# Patient Record
Sex: Male | Born: 1960 | Race: Black or African American | Hispanic: No | Marital: Married | Smoking: Never smoker
Health system: Southern US, Community
[De-identification: ages and names within clinical notes are randomized; demographics above are authoritative.]

## PROBLEM LIST (undated history)

## (undated) DIAGNOSIS — J45909 Unspecified asthma, uncomplicated: Secondary | ICD-10-CM

## (undated) HISTORY — DX: Unspecified asthma, uncomplicated: J45.909

## (undated) SURGERY — EXCISION, LESION, EYELID
Anesthesia: LOCAL | Laterality: Left

---

## 2004-10-10 ENCOUNTER — Encounter: Admission: RE | Admit: 2004-10-10 | Discharge: 2004-10-10 | Payer: Self-pay | Admitting: Occupational Medicine

## 2009-11-05 ENCOUNTER — Emergency Department (HOSPITAL_BASED_OUTPATIENT_CLINIC_OR_DEPARTMENT_OTHER): Admission: EM | Admit: 2009-11-05 | Discharge: 2009-11-05 | Payer: Self-pay | Admitting: Emergency Medicine

## 2009-11-05 ENCOUNTER — Ambulatory Visit: Payer: Self-pay | Admitting: Diagnostic Radiology

## 2010-06-10 IMAGING — CT CT ABD-PELV W/ CM
2 of 5 series · 16 of 46 positions shown, 18 images · IV contrast (APPLIED)
Comparison: None

CLINICAL DATA: Upper abdominal pain.  History of hernia repair.
History of partial gastrectomy.

CT ABDOMEN AND PELVIS WITH CONTRAST
TECHNIQUE: Multidetector CT imaging of the abdomen and pelvis was
performed following the standard protocol during bolus
administration of intravenous contrast.
Contrast: 100 ml of Imnipaque-Y33

[Series 2: abd/pelvis 5.0 b31f · axial · 0.74mm/px · z∈[-399,-29]mm · 13 of 84 slices shown, 15 images]
[im 5/84  soft-tissue]
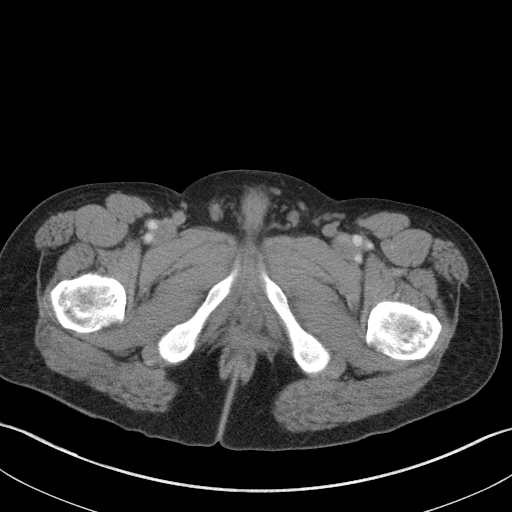
[im 5/84  bone]
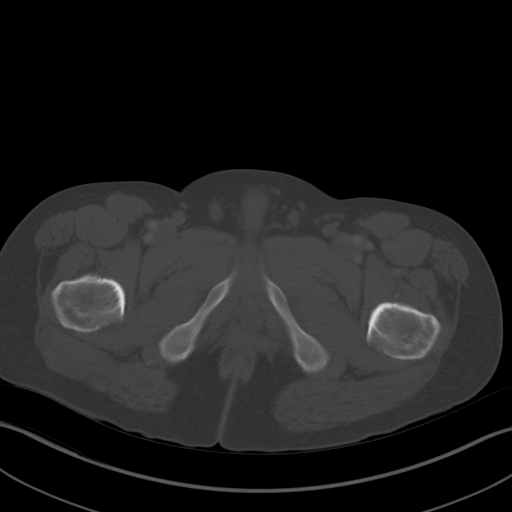
[im 10/84  soft-tissue]
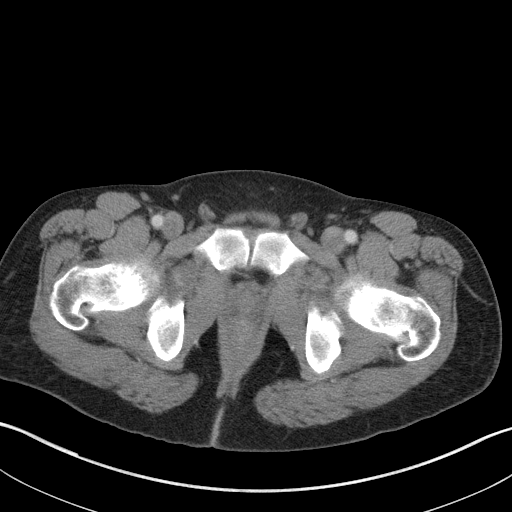
[im 19/84  soft-tissue]
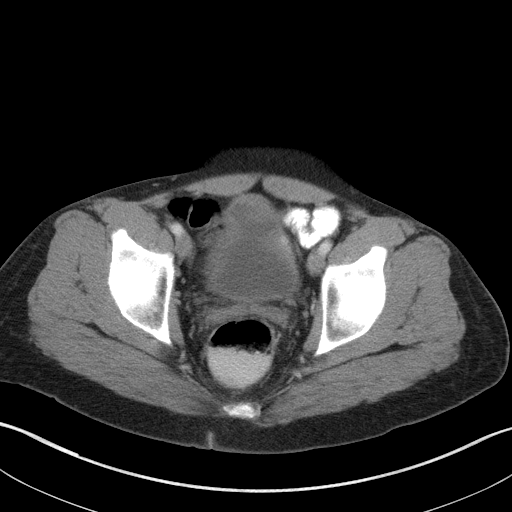
[im 24/84  soft-tissue]
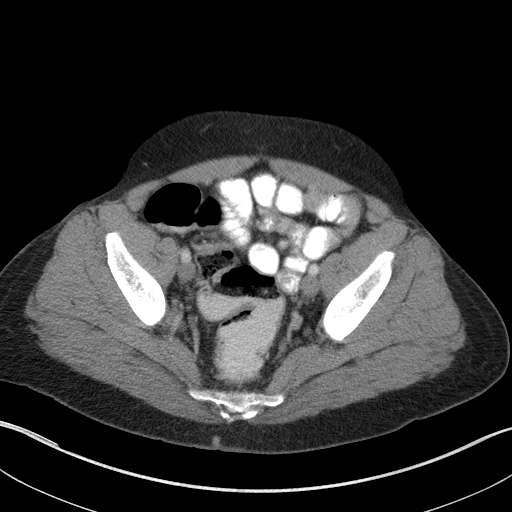
[im 28/84  soft-tissue]
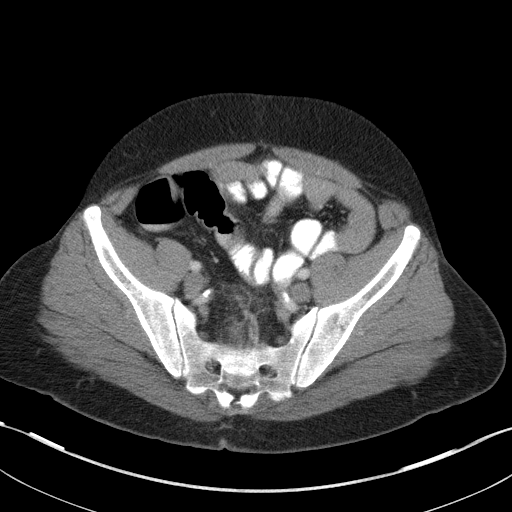
[im 37/84  soft-tissue]
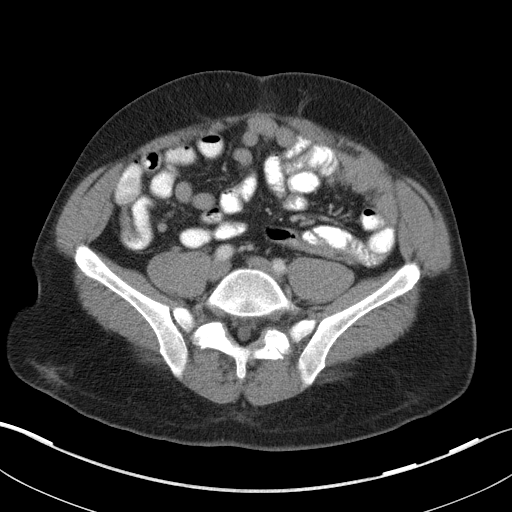
[im 42/84  soft-tissue]
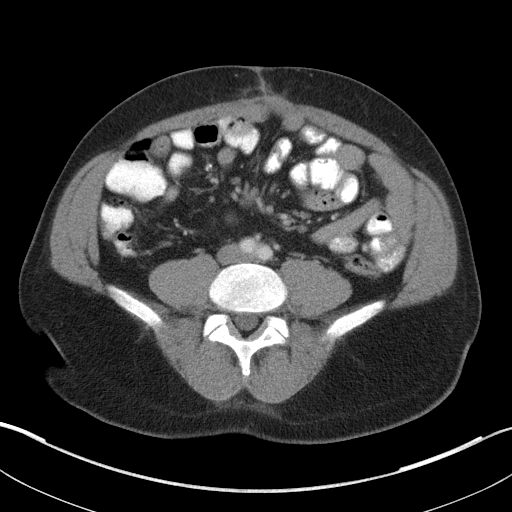
[im 47/84  soft-tissue]
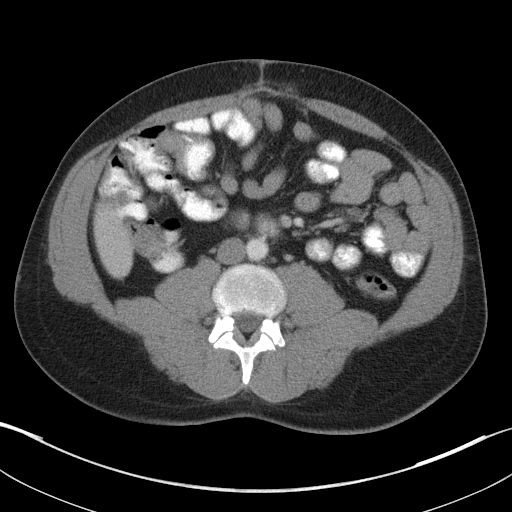
[im 56/84  soft-tissue]
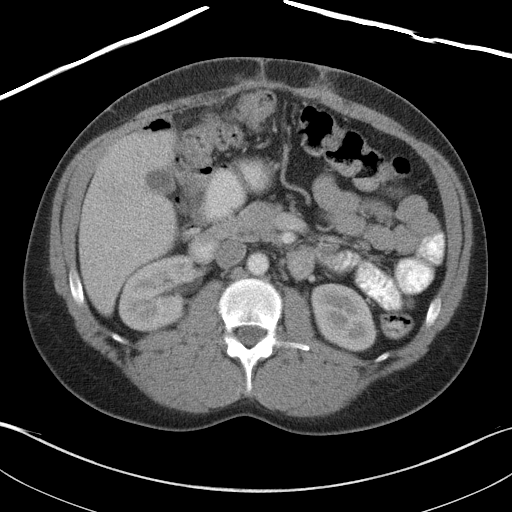
[im 56/84  bone]
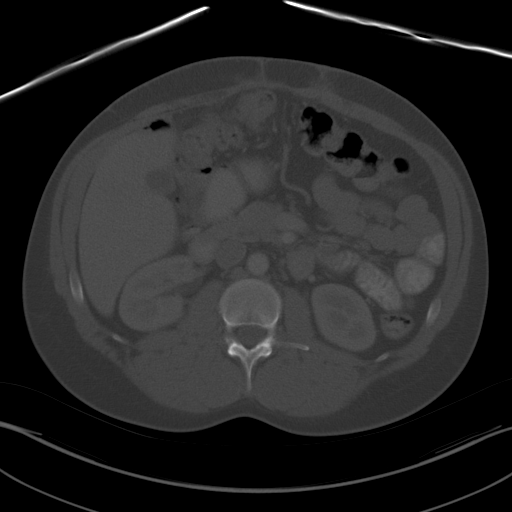
[im 60/84  soft-tissue]
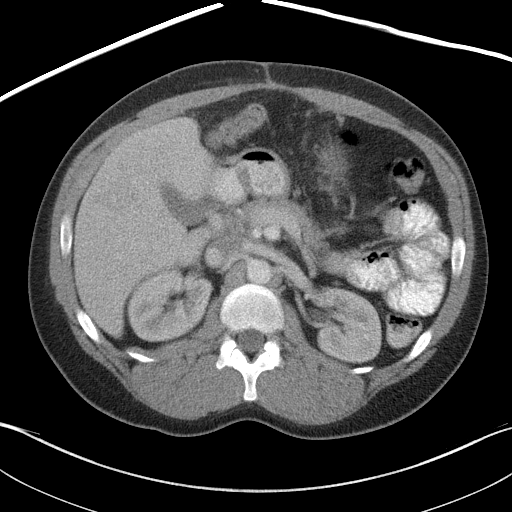
[im 65/84  soft-tissue]
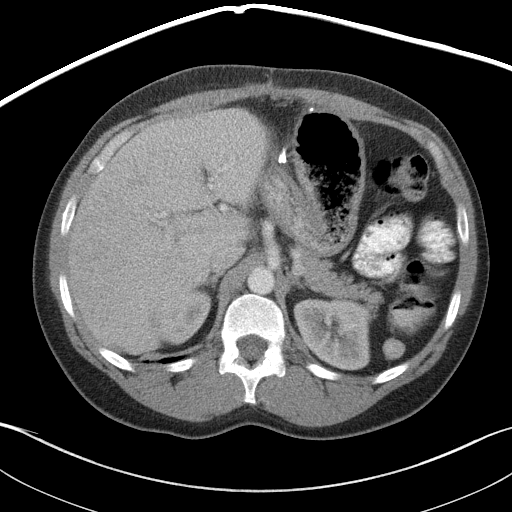
[im 74/84  soft-tissue]
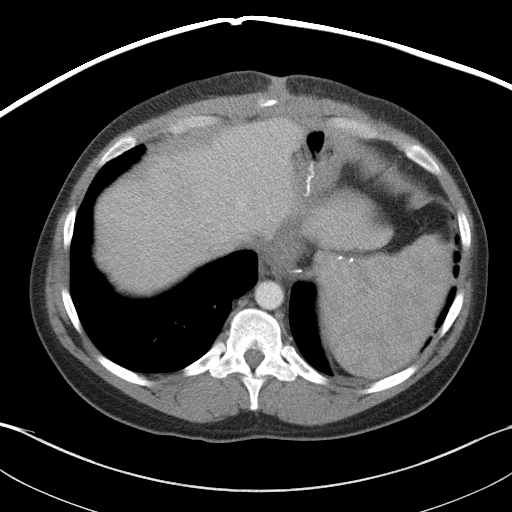
[im 79/84  soft-tissue]
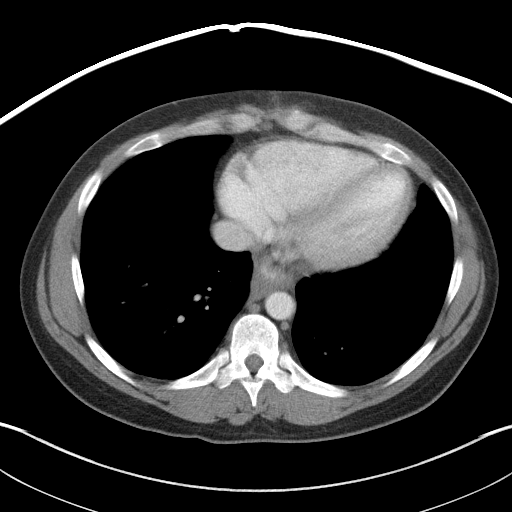

[Series 5: abd/pelvis 3.0 coronal · coronal · 0.76mm/px · 3 of 95 slices shown]
[im 32/95  soft-tissue]
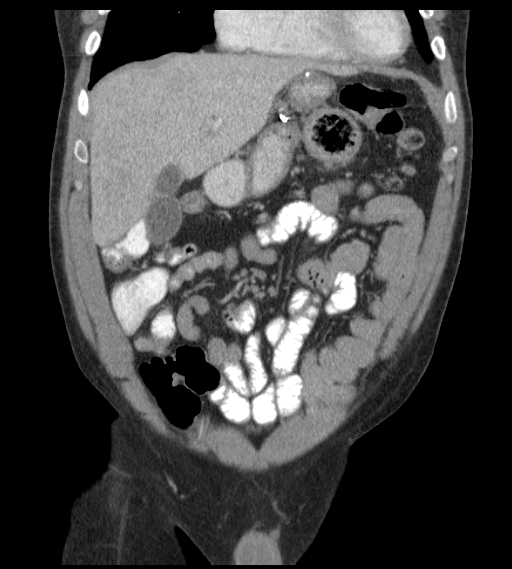
[im 42/95  soft-tissue]
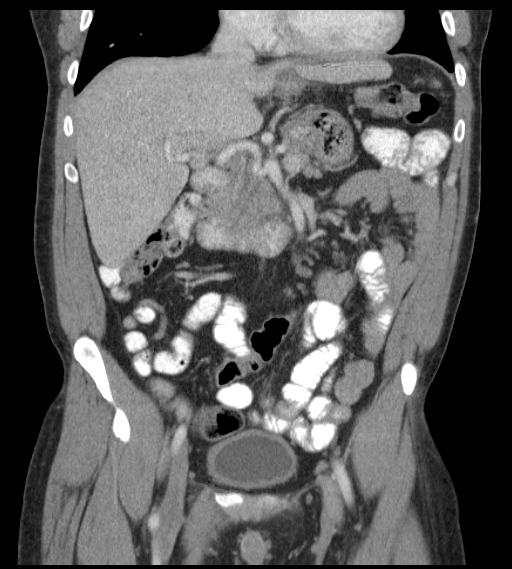
[im 53/95  soft-tissue]
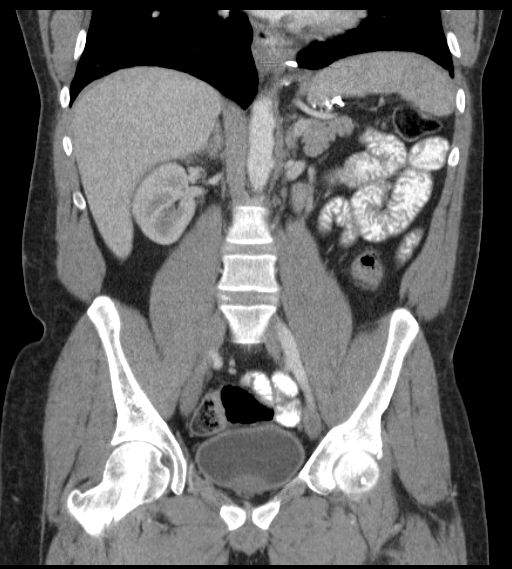

[16 of 46 positions shown; findings below may reference images not displayed]

FINDINGS: The lung bases are clear except for minimal dependent
atelectasis.  The distal esophagus demonstrates diffuse wall
thickening.  This may be due to reflux esophagitis.  A slipped
Nissen fundoplication is also possible.

There are surgical changes involving the stomach.  No complicating
features are demonstrated.  The duodenum, small bowel and colon
unremarkable.  No findings for small bowel obstruction or bowel
ischemia.

There are too small low attenuation liver lesions which are likely
benign cysts.  No biliary dilatation.  The gallbladder appears
normal.  The spleen is normal in size.  The pancreas, adrenal
glands and kidneys demonstrate no significant abnormalities.

The aorta is normal in caliber.  The major branch vessels are
normal.  No mesenteric or retroperitoneal masses or adenopathy.

There are surgical changes involving the anterior abdominal wall.
No hernia is seen.

The rectum, sigmoid colon and visualized small bowel loops are
unremarkable.  The appendix is normal.  No pelvic mass or
adenopathy.  The bladder, prostate gland and seminal vesicles are
unremarkable.  No inguinal mass or hernia.

The bony structures are unremarkable.
IMPRESSION: 1.  Abnormal appearance of the distal esophagus.  Findings could be
due to esophagitis.  A slipped Nissen fundoplication is possible.
Recommend correlation with any symptoms of dysphagia.
2.  Surgical changes involving the stomach.
3.  No acute abdominal/pelvic findings, mass lesions or adenopathy.

## 2010-12-13 LAB — DIFFERENTIAL
Eosinophils Absolute: 1.9 10*3/uL — ABNORMAL HIGH (ref 0.0–0.7)
Lymphocytes Relative: 12 % (ref 12–46)
Lymphs Abs: 0.9 10*3/uL (ref 0.7–4.0)
Monocytes Relative: 10 % (ref 3–12)
Neutrophils Relative %: 53 % (ref 43–77)

## 2010-12-13 LAB — COMPREHENSIVE METABOLIC PANEL
ALT: 15 U/L (ref 0–53)
AST: 11 U/L (ref 0–37)
Calcium: 8.7 mg/dL (ref 8.4–10.5)
Creatinine, Ser: 1 mg/dL (ref 0.4–1.5)
GFR calc Af Amer: >60 mL/min (ref >60)
Sodium: 144 mEq/L (ref 135–145)
Total Protein: 6 g/dL (ref 6.0–8.3)

## 2010-12-13 LAB — CBC
MCHC: 30.9 g/dL (ref 30.0–36.0)
RDW: 23.7 % — ABNORMAL HIGH (ref 11.5–15.5)

## 2012-02-20 ENCOUNTER — Encounter: Payer: Self-pay | Admitting: Ophthalmology

## 2012-02-20 ENCOUNTER — Other Ambulatory Visit: Payer: Self-pay | Admitting: Ophthalmology

## 2012-02-20 NOTE — Brief Op Note (Addendum)
02/21/2012  7:14 PM  PATIENT:  Alexander Hodge  51 y.o. male  PRE-OPERATIVE DIAGNOSIS:  Cysts upper and lower lids left eye  POST-OPERATIVE DIAGNOSIS:  * Same *  PROCEDURE:  Procedure(s) (LRB): Cyst excision upper lid left eye  SURGEON:  Surgeon(s) and Role:    Vita Erm., MD - Primary  PHYSICIAN ASSISTANT:   ASSISTANTS: none   ANESTHESIA:   local  EBL:   none  BLOOD ADMINISTERED:none  None  DRAINS: none   LOCAL MEDICATIONS USED:  XYLOCAINE   SPECIMEN:  Excision  DISPOSITION OF SPECIMEN:  PATHOLOGY  COUNTS:  YES  TOURNIQUET:  * No tourniquets in log *  DICTATION: .Dragon Dictation  PLAN OF CARE: Discharge to home after PACU  PATIENT DISPOSITION:  Short Stay   Delay start of Pharmacological VTE agent (>24hrs) due to surgical blood loss or risk of bleeding: not applicable

## 2012-02-20 NOTE — H&P (Signed)
  51 yo male has multiple cysts lateral canthus left eye involving the upper and lower lids.  Lesions seem to be increasing in size and number.  Cysts are sometimes tender.  Patient requested that the lesions be excised.  Patient admitted at this time for cyst excision.

## 2012-02-21 ENCOUNTER — Encounter (HOSPITAL_BASED_OUTPATIENT_CLINIC_OR_DEPARTMENT_OTHER): Payer: Self-pay | Admitting: *Deleted

## 2012-02-21 ENCOUNTER — Encounter (HOSPITAL_BASED_OUTPATIENT_CLINIC_OR_DEPARTMENT_OTHER)
Admission: RE | Disposition: A | Discharge status: Home / Self Care | Payer: Self-pay | Source: Ambulatory Visit | Attending: Ophthalmology

## 2012-02-21 ENCOUNTER — Ambulatory Visit (HOSPITAL_BASED_OUTPATIENT_CLINIC_OR_DEPARTMENT_OTHER)
Admission: RE | Admit: 2012-02-21 | Discharge: 2012-02-21 | Disposition: A | Discharge status: Home / Self Care | Payer: 59 | Source: Ambulatory Visit | Attending: Ophthalmology | Admitting: Ophthalmology

## 2012-02-21 DIAGNOSIS — H02829 Cysts of unspecified eye, unspecified eyelid: Secondary | ICD-10-CM | POA: Insufficient documentation

## 2012-02-21 HISTORY — PX: MASS EXCISION: SHX2000

## 2012-02-21 SURGERY — MINOR EXCISION OF MASS
Anesthesia: LOCAL | Site: Eye | Laterality: Left | Wound class: Clean

## 2012-02-21 MED ORDER — LIDOCAINE HCL 1 % IJ SOLN
INTRAMUSCULAR | Status: DC | PRN
  Administered 2012-02-21: 5 mL

## 2012-02-21 MED ORDER — BACITRACIN-POLYMYXIN B 500-10000 UNIT/GM OP OINT
TOPICAL_OINTMENT | OPHTHALMIC | Status: DC | PRN
  Administered 2012-02-21: 1 via OPHTHALMIC

## 2012-02-21 SURGICAL SUPPLY — 19 items
APPLICATOR COTTON TIP 6IN STRL (MISCELLANEOUS) ×3 IMPLANT
BLADE SURG 11 STRL SS (BLADE) ×3 IMPLANT
CAUTERY EYE LOW TEMP 1300F FIN (OPHTHALMIC RELATED) ×3 IMPLANT
CLOTH BEACON ORANGE TIMEOUT ST (SAFETY) ×3 IMPLANT
GAUZE SPONGE 4X4 12PLY STRL LF (GAUZE/BANDAGES/DRESSINGS) ×3 IMPLANT
GLOVE BIO SURGEON STRL SZ 6.5 (GLOVE) ×3 IMPLANT
GLOVE ECLIPSE 7.0 STRL STRAW (GLOVE) ×3 IMPLANT
MARKER SKIN DUAL TIP RULER LAB (MISCELLANEOUS) ×3 IMPLANT
NDL SAFETY ECLIPSE 18X1.5 (NEEDLE) ×2 IMPLANT
NEEDLE HYPO 18GX1.5 SHARP (NEEDLE) ×3
NEEDLE HYPO 25X5/8 SAFETYGLIDE (NEEDLE) ×3 IMPLANT
PAD ALCOHOL SWAB (MISCELLANEOUS) ×3 IMPLANT
PAD EYE OVAL STERILE LF (GAUZE/BANDAGES/DRESSINGS) ×6 IMPLANT
SPONGE GAUZE 4X4 12PLY (GAUZE/BANDAGES/DRESSINGS) ×3 IMPLANT
SUT SILK 6 0 P 1 (SUTURE) ×3 IMPLANT
SWABSTICK POVIDONE IODINE SNGL (MISCELLANEOUS) ×6 IMPLANT
SYR CONTROL 10ML LL (SYRINGE) ×3 IMPLANT
TOWEL OR 17X24 6PK STRL BLUE (TOWEL DISPOSABLE) ×3 IMPLANT
UNDERPAD 30X30 INCONTINENT (UNDERPADS AND DIAPERS) ×3 IMPLANT

## 2012-02-21 NOTE — Discharge Instructions (Signed)
Remove patch tomorrow.  Then resume drops or ointment to leftt eye 4 x day.  Call office for appointment 1 week.

## 2012-02-24 ENCOUNTER — Encounter (HOSPITAL_BASED_OUTPATIENT_CLINIC_OR_DEPARTMENT_OTHER): Payer: Self-pay

## 2012-02-24 NOTE — Brief Op Note (Addendum)
02/21/2012  12:39 PM  PATIENT:  Alexander Hodge  51 y.o. male  PRE-OPERATIVE DIAGNOSIS:  Cysts upper lid left eye POST-OPERATIVE DIAGNOSIS:  Same  PROCEDURE:  Procedure(s) (LRB): MINOR EXCISION OF MASS ()  SURGEON:  Surgeon(s) and Role:    Vita Erm., MD - Primary  PHYSICIAN ASSISTANT:   ASSISTANTS: none   ANESTHESIA:   local  EBL:     BLOOD ADMINISTERED:none  DRAINS: none   LOCAL MEDICATIONS USED:  XYLOCAINE   SPECIMEN:  Excision  DISPOSITION OF SPECIMEN:  PATHOLOGY  COUNTS:  YES  TOURNIQUET:  * No tourniquets in log *  DICTATION: .Other Dictation: Dictation Number [id number]  PLAN OF CARE: Discharge to home after PACU  PATIENT DISPOSITION:  Short Stay   Delay start of Pharmacological VTE agent (>24hrs) due to surgical blood loss or risk of bleeding: yes

## 2012-02-24 NOTE — Op Note (Addendum)
NAMEFAOLAN, [place] NO.:  [id number]  MEDICAL RECORD NO.:  [id number]  LOCATION:                                 FACILITY:  PHYSICIAN:  Salley Scarlet., M.D. DATE OF BIRTH:  DATE OF PROCEDURE:  02/22/2012 DATE OF DISCHARGE:                              OPERATIVE REPORT   This is a 51 year old gentleman who complained of having multiple cystic lesions along the upper and lower lids of the left eye with a greatest concentration located along the lateral canthus.  He stated that these structures were increasing in size and at times were tender to touch.  He requested that the larger lesions be removed.  He, therefore,is admitted at this time for excision of the cysts. PROCEDURE:  The patient was prepped and draped in the usual manner.  The upper and lower lids in the region of his lateral canthus were infiltrated with several mL of Xylocaine 1%.  A curvilinear incision was then made around the adjacent lesions located along the lateral aspect of the upper lid and lateral canthus.  The skin was then undermined using sharp and blunt dissection.  An elliptical area of tissue about 1.5 x 1.5 mm was excised.  The skin was reapproximated with 6-0 silk sutures after achieving satisfactory hemostasis.  Polysporin Ophthalmic ointment and a pressure patch was applied.  The patient tolerated the procedure well on discharge at postanesthesia recovery room in satisfactory condition.  He is instructed to keep the patch on today, to remove it tomorrow, to resume the Polysporin Ophthalmic ointment, and to see me in my office in one week.  DISCHARGE DIAGNOSIS:  Multiple cysts, upper and lower lid, left eye      Salley Scarlet., M.D.     TB/MEDQ  D:  02/22/2012  T:  02/22/2012  Job:  [id number]

## 2012-02-26 ENCOUNTER — Encounter (HOSPITAL_BASED_OUTPATIENT_CLINIC_OR_DEPARTMENT_OTHER): Payer: Self-pay | Admitting: Ophthalmology

## 2012-11-02 IMAGING — CR DG FINGER INDEX 2+V*R*
1 series · 1 of 1 positions shown · non-contrast
Comparison: None.

CLINICAL DATA: Finger pain.  Trauma.

RIGHT INDEX FINGER 2+V

[PA]
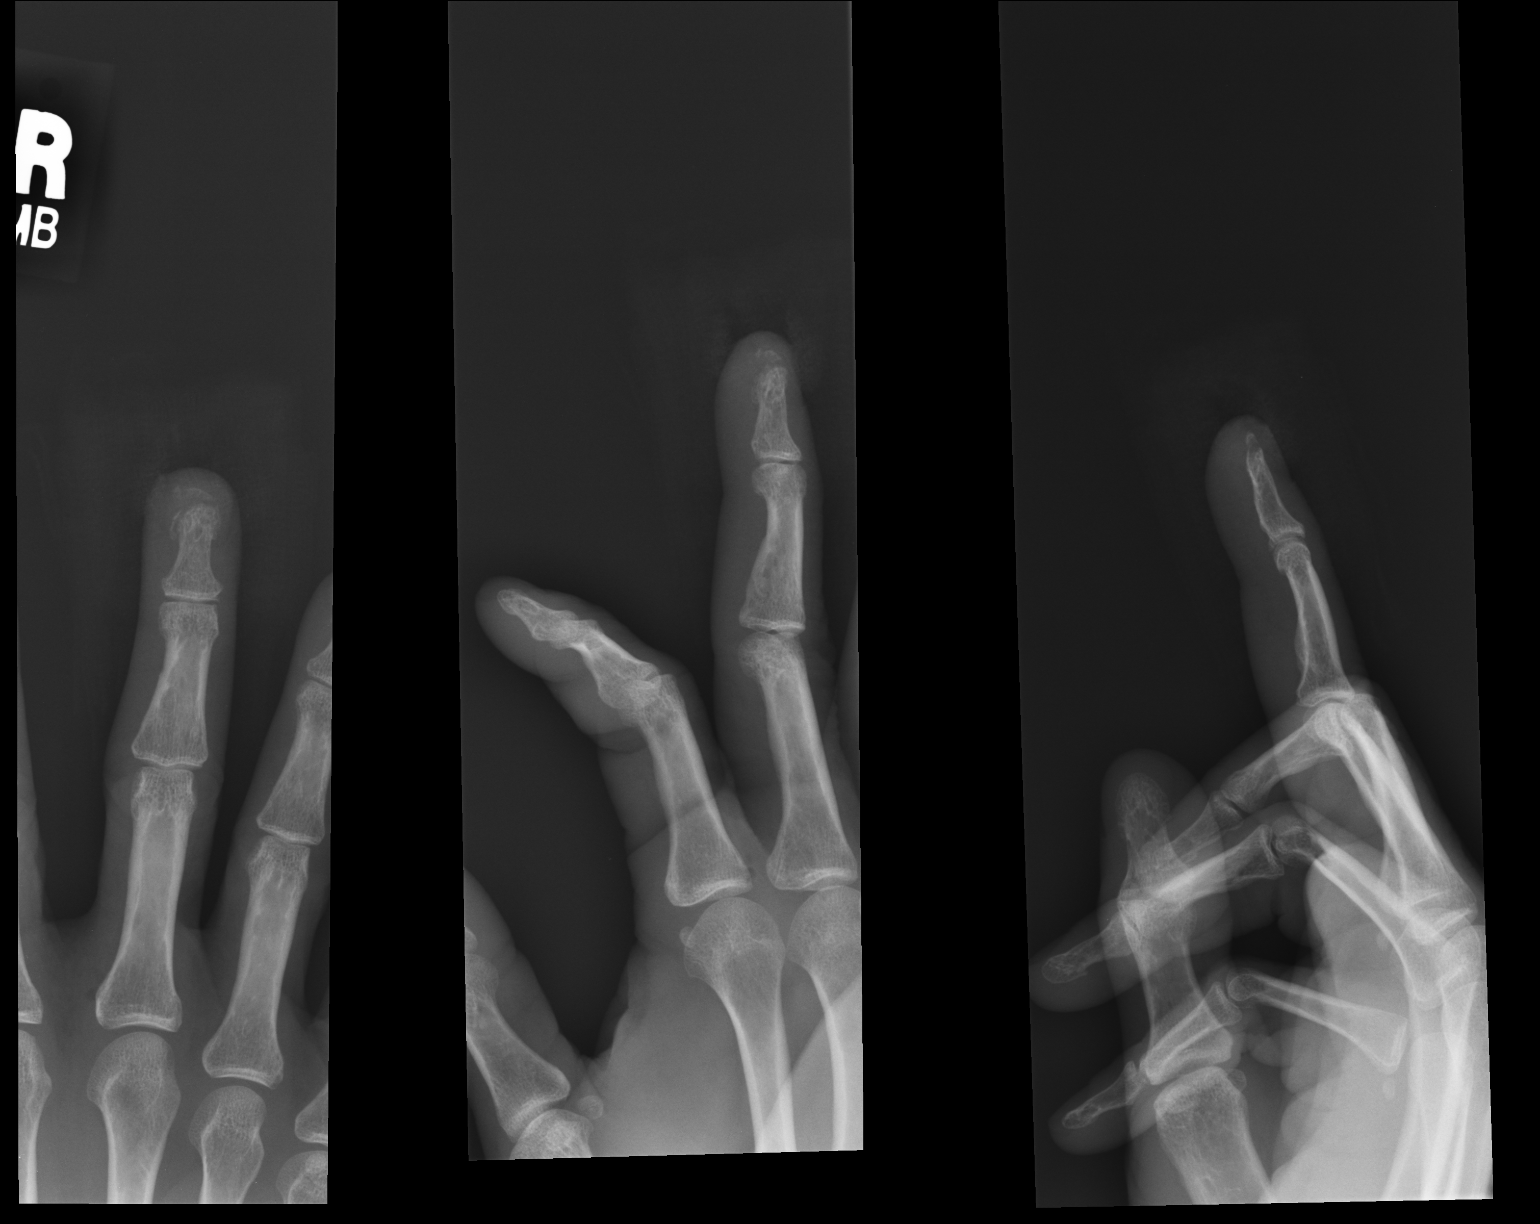

[1 of 1 positions shown; findings below may reference images not displayed]

FINDINGS: Predominately transverse but comminuted right long finger
tuft fracture is present.  Overlying soft tissue swelling.  Soft
tissue laceration along what appears to be the radial surface.

The fracture fragments are mildly displaced, with displacement of
the finger tuft fragment 1 mm.
IMPRESSION: Comminuted long finger tuft fracture.

## 2013-10-20 DIAGNOSIS — I780 Hereditary hemorrhagic telangiectasia: Secondary | ICD-10-CM | POA: Insufficient documentation

## 2013-10-20 DIAGNOSIS — R040 Epistaxis: Secondary | ICD-10-CM | POA: Insufficient documentation

## 2013-11-22 DIAGNOSIS — R0683 Snoring: Secondary | ICD-10-CM | POA: Insufficient documentation

## 2013-11-22 DIAGNOSIS — R5383 Other fatigue: Secondary | ICD-10-CM | POA: Insufficient documentation

## 2014-05-29 DIAGNOSIS — M7542 Impingement syndrome of left shoulder: Secondary | ICD-10-CM

## 2014-05-29 DIAGNOSIS — D681 Hereditary factor XI deficiency: Secondary | ICD-10-CM | POA: Insufficient documentation

## 2014-05-29 DIAGNOSIS — M25511 Pain in right shoulder: Secondary | ICD-10-CM | POA: Insufficient documentation

## 2014-05-29 DIAGNOSIS — D649 Anemia, unspecified: Secondary | ICD-10-CM | POA: Insufficient documentation

## 2014-05-29 DIAGNOSIS — M7521 Bicipital tendinitis, right shoulder: Secondary | ICD-10-CM | POA: Insufficient documentation

## 2014-05-29 DIAGNOSIS — J45909 Unspecified asthma, uncomplicated: Secondary | ICD-10-CM | POA: Insufficient documentation

## 2014-05-29 DIAGNOSIS — M7541 Impingement syndrome of right shoulder: Secondary | ICD-10-CM | POA: Insufficient documentation

## 2015-05-25 DIAGNOSIS — J3089 Other allergic rhinitis: Secondary | ICD-10-CM | POA: Insufficient documentation

## 2015-05-25 DIAGNOSIS — D681 Hereditary factor XI deficiency: Secondary | ICD-10-CM

## 2015-05-25 DIAGNOSIS — D3A092 Benign carcinoid tumor of the stomach: Secondary | ICD-10-CM | POA: Insufficient documentation

## 2015-05-25 DIAGNOSIS — J339 Nasal polyp, unspecified: Secondary | ICD-10-CM | POA: Insufficient documentation

## 2015-05-25 DIAGNOSIS — K219 Gastro-esophageal reflux disease without esophagitis: Secondary | ICD-10-CM | POA: Insufficient documentation

## 2015-05-25 DIAGNOSIS — J309 Allergic rhinitis, unspecified: Secondary | ICD-10-CM

## 2015-05-25 DIAGNOSIS — J4550 Severe persistent asthma, uncomplicated: Secondary | ICD-10-CM | POA: Insufficient documentation

## 2015-05-25 DIAGNOSIS — I780 Hereditary hemorrhagic telangiectasia: Secondary | ICD-10-CM

## 2015-06-02 ENCOUNTER — Other Ambulatory Visit: Payer: Self-pay

## 2015-06-02 MED ORDER — OMALIZUMAB 150 MG ~~LOC~~ SOLR
375.0000 mg | SUBCUTANEOUS | Status: DC
  Administered 2015-07-24 – 2021-08-02 (×51): 375 mg via SUBCUTANEOUS

## 2015-07-17 ENCOUNTER — Other Ambulatory Visit: Payer: Self-pay | Admitting: *Deleted

## 2015-07-17 MED ORDER — ALBUTEROL SULFATE HFA 108 (90 BASE) MCG/ACT IN AERS: 2.0000 | INHALATION_SPRAY | RESPIRATORY_TRACT | Status: DC | PRN

## 2015-07-24 ENCOUNTER — Ambulatory Visit (INDEPENDENT_AMBULATORY_CARE_PROVIDER_SITE_OTHER): Payer: 59

## 2015-07-24 DIAGNOSIS — J4540 Moderate persistent asthma, uncomplicated: Secondary | ICD-10-CM | POA: Diagnosis not present

## 2015-08-31 ENCOUNTER — Ambulatory Visit (INDEPENDENT_AMBULATORY_CARE_PROVIDER_SITE_OTHER): Payer: 59 | Admitting: *Deleted

## 2015-08-31 DIAGNOSIS — J4540 Moderate persistent asthma, uncomplicated: Secondary | ICD-10-CM | POA: Diagnosis not present

## 2015-09-12 ENCOUNTER — Ambulatory Visit: Payer: 59 | Admitting: Pediatrics

## 2015-09-19 DIAGNOSIS — K529 Noninfective gastroenteritis and colitis, unspecified: Secondary | ICD-10-CM | POA: Insufficient documentation

## 2015-09-19 DIAGNOSIS — D509 Iron deficiency anemia, unspecified: Secondary | ICD-10-CM | POA: Insufficient documentation

## 2015-09-19 DIAGNOSIS — J984 Other disorders of lung: Secondary | ICD-10-CM | POA: Insufficient documentation

## 2015-09-19 DIAGNOSIS — G8929 Other chronic pain: Secondary | ICD-10-CM | POA: Insufficient documentation

## 2015-09-19 DIAGNOSIS — R413 Other amnesia: Secondary | ICD-10-CM | POA: Insufficient documentation

## 2015-09-19 DIAGNOSIS — Z9189 Other specified personal risk factors, not elsewhere classified: Secondary | ICD-10-CM | POA: Insufficient documentation

## 2015-09-19 DIAGNOSIS — K2100 Gastro-esophageal reflux disease with esophagitis, without bleeding: Secondary | ICD-10-CM | POA: Insufficient documentation

## 2015-09-19 DIAGNOSIS — H9202 Otalgia, left ear: Secondary | ICD-10-CM | POA: Insufficient documentation

## 2015-09-19 DIAGNOSIS — K449 Diaphragmatic hernia without obstruction or gangrene: Secondary | ICD-10-CM | POA: Insufficient documentation

## 2015-09-19 DIAGNOSIS — Z7952 Long term (current) use of systemic steroids: Secondary | ICD-10-CM | POA: Insufficient documentation

## 2015-09-19 DIAGNOSIS — Z86010 Personal history of colonic polyps: Secondary | ICD-10-CM | POA: Insufficient documentation

## 2015-09-19 DIAGNOSIS — J4530 Mild persistent asthma, uncomplicated: Secondary | ICD-10-CM | POA: Insufficient documentation

## 2015-09-19 DIAGNOSIS — L2089 Other atopic dermatitis: Secondary | ICD-10-CM | POA: Insufficient documentation

## 2015-09-19 DIAGNOSIS — J8410 Pulmonary fibrosis, unspecified: Secondary | ICD-10-CM | POA: Insufficient documentation

## 2015-09-19 DIAGNOSIS — K625 Hemorrhage of anus and rectum: Secondary | ICD-10-CM | POA: Insufficient documentation

## 2015-09-19 DIAGNOSIS — K921 Melena: Secondary | ICD-10-CM | POA: Insufficient documentation

## 2015-09-19 DIAGNOSIS — M2550 Pain in unspecified joint: Secondary | ICD-10-CM | POA: Insufficient documentation

## 2015-09-19 DIAGNOSIS — E291 Testicular hypofunction: Secondary | ICD-10-CM | POA: Insufficient documentation

## 2015-09-19 DIAGNOSIS — N6320 Unspecified lump in the left breast, unspecified quadrant: Secondary | ICD-10-CM | POA: Insufficient documentation

## 2015-09-19 DIAGNOSIS — K648 Other hemorrhoids: Secondary | ICD-10-CM | POA: Insufficient documentation

## 2015-09-19 DIAGNOSIS — D126 Benign neoplasm of colon, unspecified: Secondary | ICD-10-CM | POA: Insufficient documentation

## 2015-09-19 DIAGNOSIS — K432 Incisional hernia without obstruction or gangrene: Secondary | ICD-10-CM | POA: Insufficient documentation

## 2015-09-19 DIAGNOSIS — K649 Unspecified hemorrhoids: Secondary | ICD-10-CM | POA: Insufficient documentation

## 2015-09-19 DIAGNOSIS — N529 Male erectile dysfunction, unspecified: Secondary | ICD-10-CM | POA: Insufficient documentation

## 2015-09-19 DIAGNOSIS — G4700 Insomnia, unspecified: Secondary | ICD-10-CM | POA: Insufficient documentation

## 2015-09-19 DIAGNOSIS — R109 Unspecified abdominal pain: Secondary | ICD-10-CM | POA: Insufficient documentation

## 2015-09-19 DIAGNOSIS — F339 Major depressive disorder, recurrent, unspecified: Secondary | ICD-10-CM | POA: Insufficient documentation

## 2015-09-19 DIAGNOSIS — B356 Tinea cruris: Secondary | ICD-10-CM | POA: Insufficient documentation

## 2015-09-19 DIAGNOSIS — M5416 Radiculopathy, lumbar region: Secondary | ICD-10-CM | POA: Insufficient documentation

## 2015-09-19 DIAGNOSIS — D500 Iron deficiency anemia secondary to blood loss (chronic): Secondary | ICD-10-CM | POA: Insufficient documentation

## 2015-09-19 DIAGNOSIS — N401 Enlarged prostate with lower urinary tract symptoms: Secondary | ICD-10-CM | POA: Insufficient documentation

## 2015-09-19 DIAGNOSIS — R062 Wheezing: Secondary | ICD-10-CM | POA: Insufficient documentation

## 2015-09-19 DIAGNOSIS — N50811 Right testicular pain: Secondary | ICD-10-CM | POA: Insufficient documentation

## 2015-09-19 DIAGNOSIS — J479 Bronchiectasis, uncomplicated: Secondary | ICD-10-CM | POA: Insufficient documentation

## 2015-09-19 DIAGNOSIS — M159 Polyosteoarthritis, unspecified: Secondary | ICD-10-CM | POA: Insufficient documentation

## 2015-09-19 DIAGNOSIS — G4733 Obstructive sleep apnea (adult) (pediatric): Secondary | ICD-10-CM | POA: Insufficient documentation

## 2015-09-19 DIAGNOSIS — J343 Hypertrophy of nasal turbinates: Secondary | ICD-10-CM | POA: Insufficient documentation

## 2015-09-19 DIAGNOSIS — R10A2 Flank pain, left side: Secondary | ICD-10-CM | POA: Insufficient documentation

## 2015-09-19 DIAGNOSIS — M545 Low back pain: Secondary | ICD-10-CM

## 2015-09-19 DIAGNOSIS — N481 Balanitis: Secondary | ICD-10-CM | POA: Insufficient documentation

## 2015-09-19 DIAGNOSIS — G2581 Restless legs syndrome: Secondary | ICD-10-CM | POA: Insufficient documentation

## 2015-09-28 ENCOUNTER — Encounter: Payer: Self-pay | Admitting: Pediatrics

## 2015-09-28 ENCOUNTER — Ambulatory Visit (INDEPENDENT_AMBULATORY_CARE_PROVIDER_SITE_OTHER): Payer: 59 | Admitting: Pediatrics

## 2015-09-28 VITALS — BP 108/62 | HR 100 | Temp 98.3°F | Resp 24 | Ht 67.0 in | Wt 182.8 lb

## 2015-09-28 DIAGNOSIS — J4551 Severe persistent asthma with (acute) exacerbation: Secondary | ICD-10-CM

## 2015-09-28 DIAGNOSIS — K219 Gastro-esophageal reflux disease without esophagitis: Secondary | ICD-10-CM | POA: Diagnosis not present

## 2015-09-28 MED ORDER — LEVALBUTEROL HCL 1.25 MG/3ML IN NEBU
1.2500 mg | INHALATION_SOLUTION | Freq: Once | RESPIRATORY_TRACT | Status: AC
  Administered 2015-09-28: 1.25 mg via RESPIRATORY_TRACT

## 2015-09-28 MED ORDER — TIOTROPIUM BROMIDE MONOHYDRATE 1.25 MCG/ACT IN AERS: 2.0000 | INHALATION_SPRAY | Freq: Every day | RESPIRATORY_TRACT | Status: DC

## 2015-09-28 MED ORDER — LEVALBUTEROL HCL 1.25 MG/3ML IN NEBU: 1.2500 mg | INHALATION_SOLUTION | Freq: Four times a day (QID) | RESPIRATORY_TRACT | Status: DC | PRN

## 2015-09-28 MED ORDER — ALBUTEROL SULFATE HFA 108 (90 BASE) MCG/ACT IN AERS: 2.0000 | INHALATION_SPRAY | RESPIRATORY_TRACT | Status: DC | PRN

## 2015-09-28 MED ORDER — MONTELUKAST SODIUM 10 MG PO TABS: 10.0000 mg | ORAL_TABLET | Freq: Every morning | ORAL | Status: DC

## 2015-09-28 MED ORDER — FLUTICASONE FUROATE-VILANTEROL 200-25 MCG/INH IN AEPB: 1.0000 | INHALATION_SPRAY | Freq: Every day | RESPIRATORY_TRACT | Status: DC

## 2015-09-28 NOTE — Progress Notes (Signed)
[place] [address] Dept: [phone]  FOLLOW UP NOTE  Patient ID: Alexander Hodge, male    DOB: 21-Nov-1960  Age: 55 y.o. MRN: [id number] Date of Office Visit: 09/28/2015  Assessment Chief Complaint: Wheezing  HPI Alexander Hodge presents for treatment of an exacerbation of asthma. He is on Xolair 375 mg every 2 weeks He currently sees Dr. Camillo Flaming at [facility]. He was switched from Symbicort to BreoEllipta 100/25 one puff every 24 hours and they stopped the Spiriva. He feels that he did better on Spiriva.. He also placed him on Theodur . He needed 2 blood transfusions recently. He has hereditary  hemorrhagic telangiectasia  Current medications are montelukast asked 10 mg once a dayTheodur 300 mg every 12 hours, Xopenex 1.25 mg every 6 hours if needed, Ventolin 2 puffs every 4 hours if needed, fluticasone 1 spray per nostril once a day if needed, omeprazole 40 mg once a day his other medications are outlined in the chart   Drug Allergies:  Allergies  Allergen Reactions  . Eggs Or Egg-Derived Products Shortness Of Breath  . Fish-Derived Products Rash    Unknown reaction    Physical Exam: BP 108/62 mmHg  Pulse 100  Temp(Src) 98.3 F (36.8 C) (Oral)  Resp 24  Ht 5\' 7"  (1.702 m)  Wt 182 lb 12.2 oz (82.9 kg)  BMI 28.62 kg/m2   Physical Exam  Constitutional: He is oriented to person, place, and time. He appears well-developed and well-nourished.  HENT:  Eyes normal. Ears normal. Nose normal. Pharynx normal.  Neck: Neck supple.  Cardiovascular: Normal rate and regular rhythm.   S1 and S2 normal no murmurs  Pulmonary/Chest:  Clear to percussion auscultation except for some decreased breath sounds at the bases and mild wheezing  Lymphadenopathy:    He has no cervical adenopathy.  Neurological: He is alert and oriented to person, place, and time.  Psychiatric: He has a normal mood and affect. His behavior is normal. Judgment and thought content normal.    Vitals reviewed.   Diagnostics:   FVC 2.37 L FEV1 1.17 L. Predicted FVC 3.67 L predicted FEV1 2.91 L-shows a moderate reduction in the forced vital capacity and FEV1  Assessment and Plan: 1. Asthma with acute exacerbation, severe persistent   2. Gastroesophageal reflux disease without esophagitis     Meds ordered this encounter  Medications  . albuterol (VENTOLIN HFA) 108 (90 Base) MCG/ACT inhaler    Sig: Inhale 2 puffs into the lungs every 4 (four) hours as needed for wheezing or shortness of breath.    Dispense:  1 Inhaler    Refill:  1  . levalbuterol (XOPENEX) 1.25 MG/3ML nebulizer solution    Sig: Take 1.25 mg by nebulization every 6 (six) hours as needed for wheezing.    Dispense:  72 mL    Refill:  1  . montelukast (SINGULAIR) 10 MG tablet    Sig: Take 1 tablet (10 mg total) by mouth every morning.    Dispense:  30 tablet    Refill:  5  . levalbuterol (XOPENEX) nebulizer solution 1.25 mg    Sig:   . Tiotropium Bromide Monohydrate (SPIRIVA RESPIMAT) 1.25 MCG/ACT AERS    Sig: Inhale 2 puffs into the lungs daily.    Dispense:  4 g    Refill:  3  . Fluticasone Furoate-Vilanterol (BREO ELLIPTA) 200-25 MCG/INH AEPB    Sig: Inhale 1 puff into the lungs daily.    Dispense:  1  each    Refill:  3    Patient Instructions  Continue on the medications prescribed by Dr. Camillo Flaming except for BreoEllipta 100/25 Add prednisone 20 mg twice a day for 3 days, 20 mg on the fourth day, 10 mg on the fifth day Start Spiriva Respimat 1.25-2 puffs every 24 hours BreoEllipta 200/25--1 puff every 24 hours instead of BreoEllipta 100/25 Ask Dr. Dorrene German whether you should continue on Theodur  due to your stomach problems and bleeding We will resume your allergy injections Keep your follow-up appointment with Dr.Ejaz in about a month     Return in about 6 months (around 03/27/2016).    Thank you for the opportunity to care for this patient.  Please do not hesitate to contact me with  questions.  Penne Lash, M.D.  [facility] [address] [place], [place] [address] [phone]

## 2015-09-28 NOTE — Patient Instructions (Addendum)
Continue on the medications prescribed by Dr. Camillo Flaming except for BreoEllipta 100/25 Add prednisone 20 mg twice a day for 3 days, 20 mg on the fourth day, 10 mg on the fifth day Start Spiriva Respimat 1.25-2 puffs every 24 hours BreoEllipta 200/25--1 puff every 24 hours instead of BreoEllipta 100/25 Ask Dr. Dorrene German whether you should continue on Theodur  due to your stomach problems and bleeding We will resume your allergy injections Keep your follow-up appointment with Dr.Ejaz in about a month

## 2015-11-07 ENCOUNTER — Ambulatory Visit: Payer: 59 | Admitting: Pediatrics

## 2015-12-28 DIAGNOSIS — R0989 Other specified symptoms and signs involving the circulatory and respiratory systems: Secondary | ICD-10-CM | POA: Insufficient documentation

## 2015-12-28 DIAGNOSIS — Z9189 Other specified personal risk factors, not elsewhere classified: Secondary | ICD-10-CM | POA: Insufficient documentation

## 2016-01-16 ENCOUNTER — Ambulatory Visit (INDEPENDENT_AMBULATORY_CARE_PROVIDER_SITE_OTHER): Payer: 59

## 2016-01-16 ENCOUNTER — Encounter: Payer: Self-pay | Admitting: Pediatrics

## 2016-01-16 ENCOUNTER — Ambulatory Visit (INDEPENDENT_AMBULATORY_CARE_PROVIDER_SITE_OTHER): Payer: 59 | Admitting: Pediatrics

## 2016-01-16 VITALS — BP 110/72 | HR 98 | Temp 98.1°F | Resp 20 | Ht 66.0 in

## 2016-01-16 DIAGNOSIS — J301 Allergic rhinitis due to pollen: Secondary | ICD-10-CM | POA: Insufficient documentation

## 2016-01-16 DIAGNOSIS — J4540 Moderate persistent asthma, uncomplicated: Secondary | ICD-10-CM

## 2016-01-16 DIAGNOSIS — J4551 Severe persistent asthma with (acute) exacerbation: Secondary | ICD-10-CM | POA: Diagnosis not present

## 2016-01-16 DIAGNOSIS — K219 Gastro-esophageal reflux disease without esophagitis: Secondary | ICD-10-CM | POA: Diagnosis not present

## 2016-01-16 DIAGNOSIS — D681 Hereditary factor XI deficiency: Secondary | ICD-10-CM

## 2016-01-16 MED ORDER — THEOPHYLLINE ER 300 MG PO TB12: 300.0000 mg | ORAL_TABLET | Freq: Two times a day (BID) | ORAL | Status: DC

## 2016-01-16 MED ORDER — ALBUTEROL SULFATE HFA 108 (90 BASE) MCG/ACT IN AERS: 2.0000 | INHALATION_SPRAY | RESPIRATORY_TRACT | Status: DC | PRN

## 2016-01-16 MED ORDER — MONTELUKAST SODIUM 10 MG PO TABS: 10.0000 mg | ORAL_TABLET | Freq: Every morning | ORAL | Status: DC

## 2016-01-16 NOTE — Patient Instructions (Addendum)
Continue on your current medications Resume her Xolair injections. He tolerated 3 injections today without any problems and will resume them every 2 weeks. He will resume his allergy injections

## 2016-01-16 NOTE — Progress Notes (Signed)
[place] [address] Dept: [phone]  FOLLOW UP NOTE  Patient ID: Alexander Hodge, male    DOB: 1961/09/03  Age: 55 y.o. MRN: [id number] Date of Office Visit: 01/16/2016  Assessment Chief Complaint: Follow-up  HPI Alexander Hodge presents for follow-up of asthma and allergic rhinitis. He stopped taking her Xolair injections about 6 months ago and the Xolair injections had  helped him greatly. He was hospitalized for a day about 4 days ago because of an asthma exacerbation. He would like to resume his allergy injections. He is allergic to grass pollen and some tree pollens in one vial  and weeds, mold and mite in the other vial. His most recent hemoglobin was over 8  Current medications are Ventolin 2 puffs every 4 hours if needed BreoEllipta 200-25 one puff every 24 hours, montelukast 10 mg once a day, omeprazole 40 mg once a day, Theo-Dur  300 mg every 12 hours, Spiriva Respimat 1.25 -2 puff every 24 hours. His other medications are outlined in the chart   Drug Allergies:  Allergies  Allergen Reactions  . Eggs Or Egg-Derived Products Shortness Of Breath  . Eicosapentaenoic Acid (Epa) Rash    Unknown reaction  . Fish-Derived Products Rash    Unknown reaction    Physical Exam: BP 110/72 mmHg  Pulse 98  Temp(Src) 98.1 F (36.7 C) (Oral)  Resp 20  Ht 5\' 6"  (1.676 m)   Physical Exam  Constitutional: He is oriented to person, place, and time. He appears well-developed and well-nourished.  HENT:  Eyes normal. Ears normal. Nose moderate swelling of nasal turbinates. Pharynx normal.  Neck: Neck supple.  Cardiovascular:  S1 and S2 normal no murmurs  Pulmonary/Chest:  Clear to percussion auscultation  Lymphadenopathy:    He has no cervical adenopathy.  Neurological: He is alert and oriented to person, place, and time.  Skin:  Clear  Psychiatric: He has a normal mood and affect. His behavior is normal. Judgment and thought content normal.  Vitals  reviewed.   Diagnostics:   FVC 3.50 L FEV1 1.60 L predicted FVC 3.56 L predicted FEV1 2.82 L-this shows a moderate reduction in the FEV1  Assessment and Plan: 1. Severe persistent asthma, with acute exacerbation   2. Gastroesophageal reflux disease without esophagitis   3. 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 albuterol (VENTOLIN HFA) 108 (90 Base) MCG/ACT inhaler    Sig: Inhale 2 puffs into the lungs every 4 (four) hours as needed for wheezing or shortness of breath.    Dispense:  1 Inhaler    Refill:  3  . montelukast (SINGULAIR) 10 MG tablet    Sig: Take 1 tablet (10 mg total) by mouth every morning.    Dispense:  30 tablet    Refill:  5  . theophylline (THEODUR) 300 MG 12 hr tablet    Sig: Take 1 tablet (300 mg total) by mouth 2 (two) times daily.    Dispense:  60 tablet    Refill:  5    Patient Instructions  Continue on your current medications Resume her Xolair injections. He tolerated 3 injections today without any problems and will resume them every 2 weeks. He will resume his allergy injections    Return in about 3 months (around 04/16/2016).    Thank you for the  opportunity to care for this patient.  Please do not hesitate to contact me with questions.  Penne Lash, M.D.  [facility] [address] [place], [place] [address] [phone]

## 2016-01-26 ENCOUNTER — Telehealth: Payer: Self-pay | Admitting: *Deleted

## 2016-01-26 NOTE — Telephone Encounter (Signed)
Left detailed message.   

## 2016-01-26 NOTE — Telephone Encounter (Signed)
Let's give him half the dose of Xolair next week. Let me know if he has any wheezing. He is to report to [place] how he did. Make sure he takes one Zyrtec the morning before he comes for Xolair injection

## 2016-01-26 NOTE — Telephone Encounter (Signed)
PT SAID HE STARTED BACK ON XOLAIR LAST WEEK. THAT NIGHT HE HAD A VERY BAD ASTHMA ATTACK AND WAS HOSPITALIZED. HE IS WONDERING IF HE SHOULD CONTINUE ON THE Arvid Right. HE WAS HAVING TROUBLE BEFORE HE CAME IN. HOWEVER, HE FEELS LIKE THE XOLAIR IS WHAT CAUSED THE ATTACK. HE IS SUGGESTING MAYBE WE SHOULD LOWER THE DOSE. HE IS SCHEDULED NEXT WEEK FOR ANOTHER Arvid Right SHOT. WOULD LIKE A RETURN CALL FOR ADVISE.

## 2016-02-01 ENCOUNTER — Ambulatory Visit (INDEPENDENT_AMBULATORY_CARE_PROVIDER_SITE_OTHER): Payer: 59

## 2016-02-01 DIAGNOSIS — J4540 Moderate persistent asthma, uncomplicated: Secondary | ICD-10-CM | POA: Diagnosis not present

## 2016-02-14 ENCOUNTER — Ambulatory Visit (INDEPENDENT_AMBULATORY_CARE_PROVIDER_SITE_OTHER): Payer: 59

## 2016-02-14 DIAGNOSIS — J4540 Moderate persistent asthma, uncomplicated: Secondary | ICD-10-CM

## 2016-03-28 DIAGNOSIS — D472 Monoclonal gammopathy: Secondary | ICD-10-CM | POA: Insufficient documentation

## 2016-04-03 ENCOUNTER — Ambulatory Visit (INDEPENDENT_AMBULATORY_CARE_PROVIDER_SITE_OTHER): Payer: 59

## 2016-04-03 DIAGNOSIS — J4540 Moderate persistent asthma, uncomplicated: Secondary | ICD-10-CM | POA: Diagnosis not present

## 2016-04-25 ENCOUNTER — Ambulatory Visit (INDEPENDENT_AMBULATORY_CARE_PROVIDER_SITE_OTHER): Payer: 59

## 2016-04-25 DIAGNOSIS — J4540 Moderate persistent asthma, uncomplicated: Secondary | ICD-10-CM

## 2016-05-13 ENCOUNTER — Ambulatory Visit (INDEPENDENT_AMBULATORY_CARE_PROVIDER_SITE_OTHER): Payer: 59

## 2016-05-13 DIAGNOSIS — J4540 Moderate persistent asthma, uncomplicated: Secondary | ICD-10-CM | POA: Diagnosis not present

## 2016-05-30 ENCOUNTER — Ambulatory Visit (INDEPENDENT_AMBULATORY_CARE_PROVIDER_SITE_OTHER): Payer: 59

## 2016-05-30 DIAGNOSIS — J4540 Moderate persistent asthma, uncomplicated: Secondary | ICD-10-CM

## 2016-06-12 ENCOUNTER — Ambulatory Visit: Payer: 59

## 2016-06-17 DIAGNOSIS — B351 Tinea unguium: Secondary | ICD-10-CM | POA: Insufficient documentation

## 2016-07-30 ENCOUNTER — Ambulatory Visit (INDEPENDENT_AMBULATORY_CARE_PROVIDER_SITE_OTHER): Payer: 59

## 2016-07-30 DIAGNOSIS — R195 Other fecal abnormalities: Secondary | ICD-10-CM | POA: Insufficient documentation

## 2016-07-30 DIAGNOSIS — J4540 Moderate persistent asthma, uncomplicated: Secondary | ICD-10-CM | POA: Diagnosis not present

## 2016-08-09 DIAGNOSIS — J209 Acute bronchitis, unspecified: Secondary | ICD-10-CM | POA: Insufficient documentation

## 2016-08-13 ENCOUNTER — Ambulatory Visit (INDEPENDENT_AMBULATORY_CARE_PROVIDER_SITE_OTHER): Payer: 59

## 2016-08-13 DIAGNOSIS — J4540 Moderate persistent asthma, uncomplicated: Secondary | ICD-10-CM

## 2016-08-27 ENCOUNTER — Ambulatory Visit (INDEPENDENT_AMBULATORY_CARE_PROVIDER_SITE_OTHER): Payer: 59

## 2016-08-27 DIAGNOSIS — J4540 Moderate persistent asthma, uncomplicated: Secondary | ICD-10-CM | POA: Diagnosis not present

## 2016-09-09 ENCOUNTER — Ambulatory Visit (INDEPENDENT_AMBULATORY_CARE_PROVIDER_SITE_OTHER): Payer: 59

## 2016-09-09 DIAGNOSIS — J4540 Moderate persistent asthma, uncomplicated: Secondary | ICD-10-CM

## 2016-09-10 ENCOUNTER — Ambulatory Visit: Payer: 59

## 2016-09-24 ENCOUNTER — Ambulatory Visit: Payer: 59

## 2016-09-26 ENCOUNTER — Ambulatory Visit (INDEPENDENT_AMBULATORY_CARE_PROVIDER_SITE_OTHER): Payer: 59

## 2016-09-26 DIAGNOSIS — J4540 Moderate persistent asthma, uncomplicated: Secondary | ICD-10-CM

## 2016-10-08 ENCOUNTER — Encounter: Payer: Self-pay | Admitting: Pediatrics

## 2016-10-08 ENCOUNTER — Ambulatory Visit: Payer: 59 | Admitting: Pediatrics

## 2016-10-08 ENCOUNTER — Ambulatory Visit (INDEPENDENT_AMBULATORY_CARE_PROVIDER_SITE_OTHER): Payer: 59 | Admitting: Pediatrics

## 2016-10-08 VITALS — BP 126/74 | HR 109 | Temp 98.2°F | Resp 16

## 2016-10-08 DIAGNOSIS — T7800XD Anaphylactic reaction due to unspecified food, subsequent encounter: Secondary | ICD-10-CM | POA: Diagnosis not present

## 2016-10-08 DIAGNOSIS — J0100 Acute maxillary sinusitis, unspecified: Secondary | ICD-10-CM | POA: Diagnosis not present

## 2016-10-08 DIAGNOSIS — J019 Acute sinusitis, unspecified: Secondary | ICD-10-CM | POA: Insufficient documentation

## 2016-10-08 DIAGNOSIS — D681 Hereditary factor XI deficiency: Secondary | ICD-10-CM

## 2016-10-08 DIAGNOSIS — J4551 Severe persistent asthma with (acute) exacerbation: Secondary | ICD-10-CM

## 2016-10-08 DIAGNOSIS — T7800XA Anaphylactic reaction due to unspecified food, initial encounter: Secondary | ICD-10-CM | POA: Insufficient documentation

## 2016-10-08 MED ORDER — TIOTROPIUM BROMIDE MONOHYDRATE 18 MCG IN CAPS: 18.0000 ug | ORAL_CAPSULE | Freq: Every day | RESPIRATORY_TRACT | 3 refills | Status: DC

## 2016-10-08 MED ORDER — EPINEPHRINE 0.3 MG/0.3ML IJ SOAJ: INTRAMUSCULAR | 1 refills | Status: DC

## 2016-10-08 MED ORDER — LEVALBUTEROL HCL 1.25 MG/3ML IN NEBU: 1.2500 mg | INHALATION_SOLUTION | Freq: Four times a day (QID) | RESPIRATORY_TRACT | 1 refills | Status: DC | PRN

## 2016-10-08 MED ORDER — MONTELUKAST SODIUM 10 MG PO TABS: 10.0000 mg | ORAL_TABLET | Freq: Every day | ORAL | 3 refills | Status: DC

## 2016-10-08 MED ORDER — AMOXICILLIN-POT CLAVULANATE 875-125 MG PO TABS: ORAL_TABLET | ORAL | 0 refills | Status: DC

## 2016-10-08 MED ORDER — FLUTICASONE FUROATE-VILANTEROL 100-25 MCG/INH IN AEPB: 1.0000 | INHALATION_SPRAY | Freq: Every day | RESPIRATORY_TRACT | 3 refills | Status: AC

## 2016-10-08 NOTE — Patient Instructions (Addendum)
BreoEllipta 100s-25 - one puff every 24 hours Ventolin 2 puffs every 4 hours if needed for wheezing or coughing spells Montelukast 10 mg once a day,  Theo-Dur 300 mg every 12 hours,  Spiriva HandiHaler 18 mcg- 1 puff every 24 hours Add prednisone 20 mg twice a day for 3 days, 20 mg on the fourth day, 10 mg on the fifth day Augmentin 875 mg-take 1 tablet every 12 hours for 10 days for infection Continue Xolair every 14 days Call me if you are not doing better on this treatment plan  Continue avoiding fish and large amounts of eggs. If you have an allergic reaction take Benadryl 50 mg every 4 hours and if you have life-threatening symptoms inject  with EpiPen 0.3 mg

## 2016-10-08 NOTE — Progress Notes (Signed)
[place] [address] Dept: [phone]  FOLLOW UP NOTE  Patient ID: Alexander Hodge, male    DOB: 03-17-1961  Age: 56 y.o. MRN: [id number] Date of Office Visit: 10/08/2016  Assessment  Chief Complaint: Asthma; Cough (x's 2 days ago); Wheezing (x's 2 days ago); and Nasal Congestion (yellow,light green discharge)  HPI Alexander Hodge presents for follow-up of asthma and allergic rhinitis. His asthma has been very well controlled and he has not needed any prednisone since April 2017. He has developed nasal congestion and a postnasal drainage and a sore throat. His mucus is discolored. He cannot tolerate nasal steroids because of nosebleeds. He has a bleeding disorder. His  hematologist has been able to increase his hemoglobin from 8-13. He is tolerating Xolair well. He continues to avoid fish and whole eggs.  Current medications are outlined in the chart   Drug Allergies:  Allergies  Allergen Reactions  . Eggs Or Egg-Derived Products Shortness Of Breath  . Eicosapentaenoic Acid (Epa) Rash and Hives    Unknown reaction Unknown reaction  . Fish-Derived Products Rash    Unknown reaction    Physical Exam: BP 126/74 (BP Location: Left Arm, Patient Position: Sitting, Cuff Size: Normal)   Pulse (!) 109   Temp 98.2 F (36.8 C) (Oral)   Resp 16   SpO2 96%    Physical Exam  Constitutional: He is oriented to person, place, and time. He appears well-developed and well-nourished.  HENT:  Eyes normal. Ears normal. Nose mild swelling of the nasal turbinates. Pharynx normal except for a yellow postnasal drainage  Neck: Neck supple.  Cardiovascular:  S1 and S2 normal no murmurs  Pulmonary/Chest:  Clear to percussion auscultation except for mild wheezing in both lungs  Lymphadenopathy:    He has no cervical adenopathy.  Neurological: He is alert and oriented to person, place, and time.  Psychiatric: He has a normal mood and affect. His behavior is normal. Judgment and  thought content normal.  Vitals reviewed.   Diagnostics:  FVC 2.16 L FEV1 1.24 L. Predicted FVC 3.66 L predicted FEV1 2.88 L. After albuterol by nebulization FVC 2.73 L FEV1 1.45 L-this shows a moderate reduction in the forced vital  capacity and FEV1 with significant improvement after albuterol  Assessment and Plan: 1. Severe persistent asthma with acute exacerbation   2. Acute non-recurrent maxillary sinusitis   3. 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 EPINEPHrine 0.3 mg/0.3 mL IJ SOAJ injection    Sig: Use as directed for severe allergic reaction    Dispense:  4 Device    Refill:  1    Dispense mylan generic or brand name whichever is cheaper for the pt. Pt. Has coupon for both.  . levalbuterol (XOPENEX) 1.25 MG/3ML nebulizer solution    Sig: Take 1.25 mg by nebulization every 6 (six) hours as needed for wheezing.    Dispense:  72 mL    Refill:  1  . montelukast (SINGULAIR) 10 MG tablet    Sig: Take 1 tablet (10 mg total) by mouth at bedtime.    Dispense:  90 tablet    Refill:  3    For cough or wheeze. Dispense 90 days.  . fluticasone furoate-vilanterol (BREO ELLIPTA) 100-25 MCG/INH AEPB    Sig: Inhale 1 puff into the lungs daily.    Dispense:  90 each  Refill:  3    To prevent cough or wheeze. Dispense 90 day supply.  Marland Kitchen amoxicillin-clavulanate (AUGMENTIN) 875-125 MG tablet    Sig: Take one tablet by mouth every 12 hours for 10 days    Dispense:  20 tablet    Refill:  0    For infection  . tiotropium (SPIRIVA) 18 MCG inhalation capsule    Sig: Place 1 capsule (18 mcg total) into inhaler and inhale daily.    Dispense:  90 capsule    Refill:  3    For cough or wheeze.    Patient Instructions  BreoEllipta 100s-25 - one puff every 24 hours Ventolin 2 puffs every 4 hours if needed for wheezing or coughing spells Montelukast 10 mg once a day,  Theo-Dur 300 mg every 12  hours,  Spiriva HandiHaler 18 mcg- 1 puff every 24 hours Add prednisone 20 mg twice a day for 3 days, 20 mg on the fourth day, 10 mg on the fifth day Augmentin 875 mg-take 1 tablet every 12 hours for 10 days for infection Continue Xolair every 14 days Call me if you are not doing better on this treatment plan  Continue avoiding fish and large amounts of eggs. If you have an allergic reaction take Benadryl 50 mg every 4 hours and if you have life-threatening symptoms inject  with EpiPen 0.3 mg    Return in about 3 months (around 01/06/2017).    Thank you for the opportunity to care for this patient.  Please do not hesitate to contact me with questions.  Penne Lash, M.D.  [facility] [address] [place], [place] [address] [phone]

## 2016-10-09 ENCOUNTER — Ambulatory Visit: Payer: 59

## 2016-10-16 ENCOUNTER — Ambulatory Visit: Payer: 59

## 2016-10-17 ENCOUNTER — Ambulatory Visit: Payer: 59

## 2016-12-02 ENCOUNTER — Ambulatory Visit (INDEPENDENT_AMBULATORY_CARE_PROVIDER_SITE_OTHER): Payer: 59

## 2016-12-02 DIAGNOSIS — J4540 Moderate persistent asthma, uncomplicated: Secondary | ICD-10-CM

## 2016-12-16 ENCOUNTER — Ambulatory Visit (INDEPENDENT_AMBULATORY_CARE_PROVIDER_SITE_OTHER): Payer: 59

## 2016-12-16 DIAGNOSIS — J4540 Moderate persistent asthma, uncomplicated: Secondary | ICD-10-CM | POA: Diagnosis not present

## 2016-12-17 DIAGNOSIS — M2141 Flat foot [pes planus] (acquired), right foot: Secondary | ICD-10-CM | POA: Insufficient documentation

## 2016-12-17 DIAGNOSIS — M19071 Primary osteoarthritis, right ankle and foot: Secondary | ICD-10-CM | POA: Insufficient documentation

## 2016-12-30 ENCOUNTER — Ambulatory Visit (INDEPENDENT_AMBULATORY_CARE_PROVIDER_SITE_OTHER): Payer: 59

## 2016-12-30 DIAGNOSIS — J4540 Moderate persistent asthma, uncomplicated: Secondary | ICD-10-CM | POA: Diagnosis not present

## 2017-01-13 ENCOUNTER — Ambulatory Visit: Payer: Self-pay

## 2017-01-13 ENCOUNTER — Ambulatory Visit: Payer: 59 | Admitting: Pediatrics

## 2017-01-15 ENCOUNTER — Ambulatory Visit (INDEPENDENT_AMBULATORY_CARE_PROVIDER_SITE_OTHER): Payer: 59 | Admitting: *Deleted

## 2017-01-15 DIAGNOSIS — J4540 Moderate persistent asthma, uncomplicated: Secondary | ICD-10-CM

## 2017-01-29 ENCOUNTER — Ambulatory Visit: Payer: Self-pay

## 2017-01-31 ENCOUNTER — Ambulatory Visit (INDEPENDENT_AMBULATORY_CARE_PROVIDER_SITE_OTHER): Payer: 59

## 2017-01-31 DIAGNOSIS — J4540 Moderate persistent asthma, uncomplicated: Secondary | ICD-10-CM

## 2017-02-13 ENCOUNTER — Ambulatory Visit: Payer: Self-pay

## 2017-02-14 ENCOUNTER — Ambulatory Visit (INDEPENDENT_AMBULATORY_CARE_PROVIDER_SITE_OTHER): Payer: 59

## 2017-02-14 DIAGNOSIS — J4540 Moderate persistent asthma, uncomplicated: Secondary | ICD-10-CM | POA: Diagnosis not present

## 2017-02-28 ENCOUNTER — Ambulatory Visit: Payer: Self-pay

## 2017-03-03 ENCOUNTER — Ambulatory Visit: Payer: Self-pay

## 2017-04-25 ENCOUNTER — Telehealth: Payer: Self-pay | Admitting: Pediatrics

## 2017-04-25 NOTE — Telephone Encounter (Signed)
Please call the wife of Alexander Hodge who is confused about the [employer] account balance vs. EPIC and a $25 bill she just received that I don't see where that was a no show applied to either of these accounts. [phone] Thanks

## 2017-04-25 NOTE — Telephone Encounter (Signed)
Advised that we have adjusted no show fee - kt

## 2017-05-19 ENCOUNTER — Ambulatory Visit (INDEPENDENT_AMBULATORY_CARE_PROVIDER_SITE_OTHER): Payer: 59 | Admitting: Pediatrics

## 2017-05-19 ENCOUNTER — Encounter: Payer: Self-pay | Admitting: Pediatrics

## 2017-05-19 VITALS — BP 122/68 | HR 82 | Temp 98.0°F | Resp 16 | Ht 65.7 in | Wt 183.0 lb

## 2017-05-19 DIAGNOSIS — D3A092 Benign carcinoid tumor of the stomach: Secondary | ICD-10-CM

## 2017-05-19 DIAGNOSIS — J3089 Other allergic rhinitis: Secondary | ICD-10-CM | POA: Diagnosis not present

## 2017-05-19 DIAGNOSIS — Z8709 Personal history of other diseases of the respiratory system: Secondary | ICD-10-CM | POA: Diagnosis not present

## 2017-05-19 DIAGNOSIS — K219 Gastro-esophageal reflux disease without esophagitis: Secondary | ICD-10-CM

## 2017-05-19 DIAGNOSIS — J4540 Moderate persistent asthma, uncomplicated: Secondary | ICD-10-CM

## 2017-05-19 DIAGNOSIS — D681 Hereditary factor XI deficiency: Secondary | ICD-10-CM | POA: Diagnosis not present

## 2017-05-19 DIAGNOSIS — T7800XD Anaphylactic reaction due to unspecified food, subsequent encounter: Secondary | ICD-10-CM | POA: Diagnosis not present

## 2017-05-19 MED ORDER — EPINEPHRINE 0.3 MG/0.3ML IJ SOAJ: INTRAMUSCULAR | 1 refills | Status: DC

## 2017-05-19 NOTE — Patient Instructions (Addendum)
Zyrtec 10 mg once a day if needed for runny nose BreoEllipta 100-20 5-one  puff every 24 hours Ventolin 2 puffs every 4 hours if needed for wheezing or coughing spells Montelukast  10 mg once a day Theo-Dur 300 mg every 12 hours Spiriva HandiHaler 18 MCG's-1 puff every 24 hours Continue Xolair every 14 days  He should call me if he is  not doing well on this treatment plan Polysporin ointment twice a day in each  nostril for 1 week  Continue avoiding fish and whole  eggs . If he has an allergic reaction,  he will take Benadryl 50 mg every 4 hours and if he has life-threatening symptoms , he will inject with EpiPen 0.3 mg

## 2017-05-19 NOTE — Progress Notes (Signed)
[place] [address] Dept: [phone]  FOLLOW UP NOTE  Patient ID: Alexander Hodge, male    DOB: June 16, 1961  Age: 56 y.o. MRN: [id number] Date of Office Visit: 05/19/2017  Assessment  Chief Complaint: Asthma  HPI Alexander Hodge presents for follow-up of asthma and allergic rhinitis his symptoms have been much improved since he was started on Xolair. He has a factor XI deficiency and a history of anemia but with surgical procedures in his stomach, hemoglobin has been stabilized. He had a nosebleed yesterday. Otherwise his nasal congestion was under good control. He continues to avoid fish and whole eggs.  Current medications are outlined in the chart. His asthma and medications for his allergies will be outlined in the after visit summary   Drug Allergies:  Allergies  Allergen Reactions  . Eggs Or Egg-Derived Products Shortness Of Breath  . Eicosapentaenoic Acid (Epa) Rash and Hives    Unknown reaction Unknown reaction  . Fish-Derived Products Rash    Unknown reaction    Physical Exam: BP 122/68   Pulse 82   Temp 98 F (36.7 C) (Oral)   Resp 16   Ht 5' 5.7" (1.669 m)   Wt 183 lb (83 kg)   SpO2 97%   BMI 29.81 kg/m    Physical Exam  Constitutional: He is oriented to person, place, and time. He appears well-developed and well-nourished.  HENT:  Eyes normal. Ears normal. Nose showed dark clotted blood in both nostrils were he had had a nosebleed. The mucosa looked normal. Pharynx normal  Neck: Neck supple.  Cardiovascular:  S1 and S2 normal no murmurs  Pulmonary/Chest:  Clear to percussion auscultation except for mild wheezing on end expiration  Lymphadenopathy:    He has no cervical adenopathy.  Neurological: He is alert and oriented to person, place, and time.  Psychiatric: He has a normal mood and affect. His behavior is normal. Judgment and thought content normal.  Vitals reviewed.   Diagnostics:   FVC 3.39 L FEV1 1.92 L. Predicted FVC 3.53  L predicted FEV1 2.77 L-this shows a moderate reduction in the FEV1  Assessment and Plan: 1. Moderate persistent asthma without complication   2. Other allergic rhinitis   3. Anaphylactic shock due to food, subsequent encounter   4. History of nasal polyp   5. 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 Gastroesophageal reflux disease without esophagitis   7. Carcinoid tumor of stomach     Meds ordered this encounter  Medications  . EPINEPHrine 0.3 mg/0.3 mL IJ SOAJ injection    Sig: Use as directed for severe allergic reaction    Dispense:  4 Device    Refill:  1    Dispense mylan generic or brand name whichever is cheaper for the pt. Pt. Has coupon for both.    Patient Instructions  Zyrtec 10 mg once a day if needed for runny nose BreoEllipta 100-20 5-one  puff every 24 hours Ventolin 2 puffs every 4 hours if needed for wheezing or coughing spells Montelukast  10 mg once a day Theo-Dur 300 mg every 12 hours Spiriva HandiHaler 18 MCG's-1 puff every 24 hours Continue Xolair every 14 days  He should call me if he is  not doing well on this treatment plan Polysporin ointment twice a day in each  nostril for 1 week  Continue avoiding fish and whole  eggs . If he has an allergic reaction,  he will take Benadryl 50 mg every 4  hours and if he has life-threatening symptoms , he will inject with EpiPen 0.3 mg   Return in about 6 months (around 11/19/2017).    Thank you for the opportunity to care for this patient.  Please do not hesitate to contact me with questions.  Penne Lash, M.D.  [facility] [address] [place], [place] [address] [phone]

## 2017-05-23 ENCOUNTER — Ambulatory Visit (INDEPENDENT_AMBULATORY_CARE_PROVIDER_SITE_OTHER): Payer: 59 | Admitting: *Deleted

## 2017-05-23 ENCOUNTER — Ambulatory Visit: Payer: Self-pay

## 2017-05-23 DIAGNOSIS — J4540 Moderate persistent asthma, uncomplicated: Secondary | ICD-10-CM

## 2017-06-05 ENCOUNTER — Ambulatory Visit (INDEPENDENT_AMBULATORY_CARE_PROVIDER_SITE_OTHER): Payer: 59

## 2017-06-05 DIAGNOSIS — J4540 Moderate persistent asthma, uncomplicated: Secondary | ICD-10-CM | POA: Diagnosis not present

## 2017-06-06 ENCOUNTER — Ambulatory Visit: Payer: Self-pay

## 2017-06-18 ENCOUNTER — Other Ambulatory Visit: Payer: Self-pay | Admitting: *Deleted

## 2017-06-18 MED ORDER — OMALIZUMAB 150 MG ~~LOC~~ SOLR: 375.0000 mg | SUBCUTANEOUS | 11 refills | Status: DC

## 2017-06-19 ENCOUNTER — Ambulatory Visit: Payer: Self-pay

## 2017-06-20 ENCOUNTER — Ambulatory Visit (INDEPENDENT_AMBULATORY_CARE_PROVIDER_SITE_OTHER): Payer: 59

## 2017-06-20 DIAGNOSIS — J4540 Moderate persistent asthma, uncomplicated: Secondary | ICD-10-CM | POA: Diagnosis not present

## 2017-07-03 ENCOUNTER — Ambulatory Visit (INDEPENDENT_AMBULATORY_CARE_PROVIDER_SITE_OTHER): Payer: 59

## 2017-07-03 DIAGNOSIS — J4540 Moderate persistent asthma, uncomplicated: Secondary | ICD-10-CM

## 2017-07-04 ENCOUNTER — Ambulatory Visit: Payer: Self-pay

## 2017-07-18 ENCOUNTER — Ambulatory Visit (INDEPENDENT_AMBULATORY_CARE_PROVIDER_SITE_OTHER): Payer: 59

## 2017-07-18 DIAGNOSIS — J4540 Moderate persistent asthma, uncomplicated: Secondary | ICD-10-CM

## 2017-07-24 HISTORY — PX: OTHER SURGICAL HISTORY: SHX169

## 2017-08-01 ENCOUNTER — Ambulatory Visit (INDEPENDENT_AMBULATORY_CARE_PROVIDER_SITE_OTHER): Payer: 59

## 2017-08-01 DIAGNOSIS — J4540 Moderate persistent asthma, uncomplicated: Secondary | ICD-10-CM | POA: Diagnosis not present

## 2017-08-13 ENCOUNTER — Ambulatory Visit: Payer: Self-pay

## 2017-08-18 ENCOUNTER — Telehealth: Payer: Self-pay

## 2017-08-18 NOTE — Telephone Encounter (Signed)
Pt. Calling letting us know he missed his xolair appointment on Wednesday he said we had already left for the day, in which we were here all day. Today pt. States he's not feeling well today. Port site red and sore. Pt. States he will call his pcp to see if they will see him today to look at his port site. Pt. Will call at end of week if feeling better to receive his xolair.

## 2017-08-19 NOTE — Telephone Encounter (Signed)
Pt. Calling to let us know he missed his xolair bc we have already have left for the day. Not feeling good and will call the end of the week to see if he is feeling better to maybe receive his xolair.

## 2017-08-25 DIAGNOSIS — A412 Sepsis due to unspecified staphylococcus: Secondary | ICD-10-CM | POA: Insufficient documentation

## 2017-09-02 DIAGNOSIS — R0602 Shortness of breath: Secondary | ICD-10-CM | POA: Insufficient documentation

## 2017-09-02 DIAGNOSIS — E611 Iron deficiency: Secondary | ICD-10-CM | POA: Insufficient documentation

## 2017-09-02 DIAGNOSIS — Z8719 Personal history of other diseases of the digestive system: Secondary | ICD-10-CM | POA: Insufficient documentation

## 2017-09-02 DIAGNOSIS — M6289 Other specified disorders of muscle: Secondary | ICD-10-CM | POA: Insufficient documentation

## 2017-09-02 DIAGNOSIS — L2089 Other atopic dermatitis: Secondary | ICD-10-CM | POA: Insufficient documentation

## 2017-09-02 DIAGNOSIS — L309 Dermatitis, unspecified: Secondary | ICD-10-CM | POA: Insufficient documentation

## 2017-09-05 ENCOUNTER — Ambulatory Visit (INDEPENDENT_AMBULATORY_CARE_PROVIDER_SITE_OTHER): Payer: 59

## 2017-09-05 DIAGNOSIS — J4540 Moderate persistent asthma, uncomplicated: Secondary | ICD-10-CM

## 2017-09-19 ENCOUNTER — Ambulatory Visit: Payer: Self-pay

## 2017-10-12 ENCOUNTER — Other Ambulatory Visit: Payer: Self-pay | Admitting: Pediatrics

## 2017-10-14 ENCOUNTER — Encounter: Payer: Self-pay | Admitting: Pediatrics

## 2017-10-14 ENCOUNTER — Ambulatory Visit (INDEPENDENT_AMBULATORY_CARE_PROVIDER_SITE_OTHER): Payer: 59 | Admitting: Pediatrics

## 2017-10-14 VITALS — BP 108/72 | HR 109 | Temp 98.9°F | Resp 20 | Ht 67.0 in | Wt 180.0 lb

## 2017-10-14 DIAGNOSIS — J3089 Other allergic rhinitis: Secondary | ICD-10-CM | POA: Diagnosis not present

## 2017-10-14 DIAGNOSIS — T7800XD Anaphylactic reaction due to unspecified food, subsequent encounter: Secondary | ICD-10-CM | POA: Diagnosis not present

## 2017-10-14 DIAGNOSIS — K219 Gastro-esophageal reflux disease without esophagitis: Secondary | ICD-10-CM | POA: Diagnosis not present

## 2017-10-14 DIAGNOSIS — D681 Hereditary factor XI deficiency: Secondary | ICD-10-CM

## 2017-10-14 DIAGNOSIS — J4551 Severe persistent asthma with (acute) exacerbation: Secondary | ICD-10-CM | POA: Diagnosis not present

## 2017-10-14 MED ORDER — LEVALBUTEROL HCL 1.25 MG/3ML IN NEBU: INHALATION_SOLUTION | RESPIRATORY_TRACT | 2 refills | Status: DC

## 2017-10-14 MED ORDER — AZITHROMYCIN 250 MG PO TABS: ORAL_TABLET | ORAL | 0 refills | Status: DC

## 2017-10-14 NOTE — Patient Instructions (Addendum)
Continue on his current medications Add prednisone 20 mg twice a day for 3 days, 20 mg on the fourth day, 10 mg on the fifth day Xopenex one unit dose every 6 hours if needed We will re resume  the administration of Xolair 375 mg every 14 days, next week Zithromax 250 mg-take 2 tablets today, then one tablet once a day for the next 4 days Continue on his other medications Keep his follow-up appointments with Dr.Ejaz

## 2017-10-14 NOTE — Progress Notes (Signed)
[place] [address] Dept: [phone]  FOLLOW UP NOTE  Patient ID: Alexander Hodge, male    DOB: 05-14-1961  Age: 57 y.o. MRN: [id number] Date of Office Visit: 10/14/2017  Assessment  Chief Complaint: Shortness of Breath (x 3 days); Cough; Nasal Congestion (thick green); and Chills  HPI Alexander Hodge presents for follow-up of asthma and allergic rhinitis. She is followed by Dr. Camillo Flaming recommended that he go back on Xolair. In November 2018 he had pneumonia and an infected Port-A-Cath removed. He was doing well until 5 days ago when he started to have more coughing and wheezing, low-grade fever and chills. He continues to avoid eggs and fish and shellfish  Current medications are Zyrtec 10 mg once a day if needed for runny nose, Breo Ellipta 100- 1 puff every 24 hours Spiriva Respimat  1 puff every 12 hours , montelukast 10 mg once a day, Ventolin 2 puffs every 4 hours if needed or instead Xopenex one unit dose every 6 hours if needed. His other medications are outlined in the chart    Drug Allergies:  Allergies  Allergen Reactions  . Eggs Or Egg-Derived Products Shortness Of Breath  . Eicosapentaenoic Acid (Epa) Rash and Hives    Unknown reaction Unknown reaction  . Poultry Meal Shortness Of Breath  . Shellfish Allergy   . Fish-Derived Products Rash    Unknown reaction    Physical Exam: BP 108/72   Pulse (!) 109   Temp 98.9 F (37.2 C) (Oral)   Resp 20   Ht 5\' 7"  (1.702 m)   Wt 180 lb (81.6 kg)   SpO2 94%   BMI 28.19 kg/m    Physical Exam  Constitutional: He is oriented to person, place, and time. He appears well-developed and well-nourished.  HENT:  Eyes normal Ears  normal Nose mild swelling of nasal turbinates. Pharynx normal  Neck: Neck supple.  Cardiovascular:  S1 and S2 normal no murmurs  Pulmonary/Chest:  Clear to percussion and auscultation except for some rhonchi in the left lower lobe  Lymphadenopathy:    He has no cervical adenopathy.    Neurological: He is alert and oriented to person, place, and time.  Psychiatric: He has a normal mood and affect. His behavior is normal. Judgment and thought content normal.  Vitals reviewed.   Diagnostics:  FVC 3.03 L FEV1 1.44 L. Predicted FVC 3.64 L predicted FEV1 2.86 L. After albuterol by nebulization FVC 3.06 L FEV1 1.39 L-this shows a moderate reduction in the FEV1 with no significant improvement after albuterol  Assessment and Plan: 1. Severe persistent asthma with acute exacerbation   2. Other allergic rhinitis   3. Anaphylactic shock due to food, subsequent encounter   4. Gastroesophageal reflux disease without esophagitis   5. 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 levalbuterol (XOPENEX) 1.25 MG/3ML nebulizer solution    Sig: INHALE 1 VIAL BY NEBULIZATION EVERY 6 (SIX) HOURS AS NEEDED FOR WHEEZING.    Dispense:  72 mL    Refill:  2  . azithromycin (ZITHROMAX) 250 MG tablet    Sig: Two tablets on day one, then one tablet once a day, days 2-5, for infection    Dispense:  6 each    Refill:  0    Patient Instructions  Continue on his current medications Add prednisone 20 mg twice a day for 3 days, 20 mg on the fourth day, 10 mg on  the fifth day Xopenex one unit dose every 6 hours if needed We will re resume  the administration of Xolair 375 mg every 14 days, next week Zithromax 250 mg-take 2 tablets today, then one tablet once a day for the next 4 days Continue on his other medications Keep his follow-up appointments with Dr.Ejaz   Return in about 6 months (around 04/13/2018).    Thank you for the opportunity to care for this patient.  Please do not hesitate to contact me with questions.  Penne Lash, M.D.  [facility] [address] [place], [place] [address] [phone]

## 2017-10-17 ENCOUNTER — Ambulatory Visit: Payer: Self-pay

## 2017-10-24 ENCOUNTER — Ambulatory Visit (INDEPENDENT_AMBULATORY_CARE_PROVIDER_SITE_OTHER): Payer: 59

## 2017-10-24 DIAGNOSIS — J4540 Moderate persistent asthma, uncomplicated: Secondary | ICD-10-CM

## 2017-11-07 ENCOUNTER — Ambulatory Visit (INDEPENDENT_AMBULATORY_CARE_PROVIDER_SITE_OTHER): Payer: 59

## 2017-11-07 DIAGNOSIS — J4540 Moderate persistent asthma, uncomplicated: Secondary | ICD-10-CM | POA: Diagnosis not present

## 2017-11-21 ENCOUNTER — Ambulatory Visit: Payer: Self-pay

## 2017-11-24 ENCOUNTER — Encounter: Payer: Self-pay | Admitting: Pediatrics

## 2017-11-24 ENCOUNTER — Ambulatory Visit (INDEPENDENT_AMBULATORY_CARE_PROVIDER_SITE_OTHER): Payer: 59

## 2017-11-24 ENCOUNTER — Ambulatory Visit: Payer: 59 | Admitting: Pediatrics

## 2017-11-24 VITALS — BP 94/60 | HR 76 | Temp 98.2°F | Resp 16

## 2017-11-24 DIAGNOSIS — J3089 Other allergic rhinitis: Secondary | ICD-10-CM

## 2017-11-24 DIAGNOSIS — T7800XD Anaphylactic reaction due to unspecified food, subsequent encounter: Secondary | ICD-10-CM

## 2017-11-24 DIAGNOSIS — D681 Hereditary factor XI deficiency: Secondary | ICD-10-CM

## 2017-11-24 DIAGNOSIS — J4540 Moderate persistent asthma, uncomplicated: Secondary | ICD-10-CM | POA: Diagnosis not present

## 2017-11-24 DIAGNOSIS — J4550 Severe persistent asthma, uncomplicated: Secondary | ICD-10-CM

## 2017-11-24 NOTE — Patient Instructions (Addendum)
Zyrtec 10 mg once a day for runny nose or itchy eyes BreoEllipta  100-1 puff every 24 hours Spiriva Respimat 1.25 mg- one puff every 12 hours, Theodur 300 mg-take 1 tablet every 12 hours Montelukast  10 mg once a day,  Ventolin 2 puffs every 4 hours if needed or instead Xopenex one unit dose every 6 hours if needed Continue on his other medications Continue Xolair every 2 weeks Call us if he is not doing well on this treatment plan.  Continue avoiding fish, shellfish and eggs. If he has an allergic reaction take Benadryl 50 mg every 4 hours and if he has life-threatening symptoms inject with EpiPen 0.3 mg

## 2017-11-24 NOTE — Progress Notes (Signed)
  [place] [address] Dept: [phone]  FOLLOW UP NOTE  Patient ID: Alexander Hodge, male    DOB: [birth year]  Age: 57 y.o. MRN: [id number] Date of Office Visit: 11/24/2017  Assessment  Chief Complaint: Allergic Rhinitis  (itchy matted eyes, sneezing triggers wheezing) and Asthma  HPI Alexander Hodge presents for follow-up of asthma and allergic rhinitis. He resumed his Xolair injections 2 weeks ago and  the asthma has been well controlled. He has started to have symptoms from spring allergic rhinitis. Xolair should help that also. He is on Zyrtec 10 mg once a day. He has had nosebleeds from nasal steroids and he has a bleeding disorder. He continues to avoid eggs, fish and shellfish  Current medications are outlined in the chart   Drug Allergies:  Allergies  Allergen Reactions  . Eggs Or Egg-Derived Products Shortness Of Breath  . Eicosapentaenoic Acid (Epa) Rash and Hives    Unknown reaction Unknown reaction  . Poultry Meal Shortness Of Breath  . Shellfish Allergy   . Fish-Derived Products Rash    Unknown reaction    Physical Exam: BP 94/60 (BP Location: Right Arm, Patient Position: Sitting, Cuff Size: Large)   Pulse 76   Temp 98.2 F (36.8 C) (Oral)   Resp 16   SpO2 97%    Physical Exam  Constitutional: He is oriented to person, place, and time. He appears well-developed and well-nourished.  HENT:  Eyes normal. Ears normal. Nose normal. Pharynx normal.  Neck: Neck supple.  Cardiovascular:  S1 and S2 normal no murmurs  Pulmonary/Chest:  Clear to percussion and auscultation  Lymphadenopathy:    He has no cervical adenopathy.  Neurological: He is alert and oriented to person, place, and time.  Psychiatric: He has a normal mood and affect. His behavior is normal. Judgment and thought content normal.  Vitals reviewed.   Diagnostics:  FVC 3.43 L FEV1 2.17 L. Predicted FVC 3.64 L predicted FEV1 2.86 L-this shows a mild reduction in the FEV1 at 76% of  predicted  Assessment and Plan: 1. Severe persistent asthma without complication   2. Anaphylactic shock due to food, subsequent encounter   3. Other allergic rhinitis   4. 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   Patient Instructions  Zyrtec 10 mg once a day for runny nose or itchy eyes BreoEllipta  100-1 puff every 24 hours Spiriva Respimat 1.25 mg- one puff every 12 hours, Theodur 300 mg-take 1 tablet every 12 hours Montelukast  10 mg once a day,  Ventolin 2 puffs every 4 hours if needed or instead Xopenex one unit dose every 6 hours if needed Continue on his other medications Continue Xolair every 2 weeks Call us if he is not doing well on this treatment plan.  Continue avoiding fish, shellfish and eggs. If he has an allergic reaction take Benadryl 50 mg every 4 hours and if he has life-threatening symptoms inject with EpiPen 0.3 mg   Return in about 6 months (around 05/27/2018).    Thank you for the opportunity to care for this patient.  Please do not hesitate to contact me with questions.  Penne Lash, M.D.  [facility] [address] [place], [place] [address] [phone]

## 2017-12-12 ENCOUNTER — Ambulatory Visit (INDEPENDENT_AMBULATORY_CARE_PROVIDER_SITE_OTHER): Payer: 59 | Admitting: *Deleted

## 2017-12-12 DIAGNOSIS — J4550 Severe persistent asthma, uncomplicated: Secondary | ICD-10-CM

## 2017-12-12 MED ORDER — ALBUTEROL SULFATE HFA 108 (90 BASE) MCG/ACT IN AERS: 2.0000 | INHALATION_SPRAY | RESPIRATORY_TRACT | 3 refills | Status: DC | PRN

## 2017-12-16 ENCOUNTER — Encounter: Payer: Self-pay | Admitting: Family Medicine

## 2017-12-16 ENCOUNTER — Ambulatory Visit (INDEPENDENT_AMBULATORY_CARE_PROVIDER_SITE_OTHER): Payer: 59 | Admitting: Family Medicine

## 2017-12-16 VITALS — BP 106/70 | HR 84 | Temp 97.3°F | Resp 20 | Ht 67.0 in | Wt 179.0 lb

## 2017-12-16 DIAGNOSIS — J3089 Other allergic rhinitis: Secondary | ICD-10-CM | POA: Diagnosis not present

## 2017-12-16 DIAGNOSIS — L2089 Other atopic dermatitis: Secondary | ICD-10-CM

## 2017-12-16 DIAGNOSIS — Z8709 Personal history of other diseases of the respiratory system: Secondary | ICD-10-CM | POA: Diagnosis not present

## 2017-12-16 DIAGNOSIS — J4551 Severe persistent asthma with (acute) exacerbation: Secondary | ICD-10-CM | POA: Diagnosis not present

## 2017-12-16 DIAGNOSIS — T7800XD Anaphylactic reaction due to unspecified food, subsequent encounter: Secondary | ICD-10-CM

## 2017-12-16 DIAGNOSIS — D681 Hereditary factor XI deficiency: Secondary | ICD-10-CM

## 2017-12-16 NOTE — Progress Notes (Signed)
Alexander Hodge Dept: [phone]  FOLLOW UP NOTE  Patient ID: Alexander Hodge, male    DOB: 28-Feb-1961  Age: 57 y.o. MRN: [id number] Date of Office Visit: 12/16/2017  Assessment  Chief Complaint: Eczema (flare on face, back, legs.  Discuss getting into study); Asthma (some wheeze); and Nasal Congestion  HPI Alexander Hodge is a 57 year old male who presents to the clinic today for a follow-up visit.  He was last seen in this clinic on 11/24/2017 by Dr. Shaune Leeks for evaluation of allergic rhinitis severe persistent asthma, and food allergy.  At that visit, his asthma was reported as moderately well controlled on Breo Ellipta 100-1 puff every 24 hours, Spiriva Respimat 1.25-1 puff every 12 hours, Theodur 300 mg- 1 tablet every 12 hours, and montelukast 10 mg once a day.  His allergic rhinitis was reported as not well controlled and he was started on Zyrtec 10 mg once a day.  He continued Xolair injections every 2 weeks for asthma and allergic rhinitis.  At today's visit, he reports his asthma is better controlled.  He denies shortness of breath and cough, however, he reports some wheezing some of the time.  He continues to take Breo Ellipta 100-1 time a day, Spiriva Respimat 1.25-twice a day, Theodur 300 mg twice a day, and montelukast 10 mg once a day.  He reports using his albuterol nebulizer twice a day.  Additionally, he continues to use non-pharmacological methods such as meditation, warm beverages, and warm showers to manage his asthma.  He continues Xolair injections every 2 weeks and notes a significant improvement in asthma and allergic rhinitis symptoms since beginning Xolair injections.  Allergic rhinitis is reported as moderately well controlled with symptoms such as ear itching, sneezing, sore throat, and nasal congestion.  He is currently taking Zyrtec 10 mg every other day.  He reports frequent nosebleeds which are controlled within 5 minutes or less by pinching the  nares.  He has factor XI deficiency and is followed closely by [facility].    He reports his eczema as not well controlled with symptoms including dry, chapping, and flaky skin on his back, legs, and face.  He is currently using clobetasol 0.05% and triamcinolone 0.5% twice a day on his body and desonide 0.05% twice a day on his face with some short-term improvement.  He continues to utilize a daily moisturizing routine.  His current medications are listed in the chart.  Drug Allergies:  Allergies  Allergen Reactions  . Eggs Or Egg-Derived Products Shortness Of Breath  . Eicosapentaenoic Acid (Epa) Rash and Hives    Unknown reaction Unknown reaction  . Poultry Meal Shortness Of Breath  . Shellfish Allergy   . Fish-Derived Products Rash    Unknown reaction    Physical Exam: BP 106/70 (BP Location: Right Arm, Patient Position: Sitting, Cuff Size: Normal)   Pulse 84   Temp (!) 97.3 F (36.3 C) (Oral)   Resp 20   Ht 5\' 7"  (1.702 m)   Wt 179 lb (81.2 kg)   SpO2 97%   BMI 28.04 kg/m    Physical Exam  Constitutional: He is oriented to person, place, and time. He appears well-developed and well-nourished.  HENT:  Head: Normocephalic and atraumatic.  Right Ear: External ear normal.  Left Ear: External ear normal.  Bilateral nares erythematous and edematous with bloody drainage and clots.  Ears normal.  Bilateral conjunctive are slightly injected.  Pharynx normal.  Neck:  Normal range of motion. Neck supple.  Cardiovascular: Normal rate, regular rhythm and normal heart sounds.  No murmur noted  Pulmonary/Chest: Effort normal.  Bilateral expiratory wheeze noted.  Musculoskeletal: Normal range of motion.  Neurological: He is alert and oriented to person, place, and time.  Skin: Skin is warm and dry.  Dry flaky skin noted on bilateral lower extremities and his back with no open areas or drainage noted.  Dry darkened flaky skin noted on his face with no  open areas or drainage noted.  Psychiatric: He has a normal mood and affect. His behavior is normal. Judgment and thought content normal.    Diagnostics: FVC 2.58, FEV1 1.27.  Predicted FVC 3.64, predicted FEV1 2.86.  Spirometry reveals mild restriction and moderate airway obstruction which is consistent with his previous study.  Assessment and Plan: 1. Severe persistent asthma with acute exacerbation   2. 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 History of nasal polyp   4. Anaphylactic shock due to food, subsequent encounter   5. Other allergic rhinitis   6. Flexural atopic dermatitis      Patient Instructions  You can try Allegra D every 12 hours as needed for runny nose or itchy eyes with nasal congestion. Stop Zyrtec while taking Allegra D Continue BreoEllipta  100-1 puff every 24 hours to prevent cough or wheeze Continue Spiriva Respimat 1.25 mg- one puff every 12 hours to prevent cough or wheeze Continue Theodur 300 mg-take 1 tablet every 12 hours to prevent cough or wheeze Continue Montelukast  10 mg once a day to prevent cough or wheeze Ventolin 2 puffs every 4 hours if needed or instead Xopenex one unit dose every 6 hours if needed for cough, wheeze, or shortness of breath Continue on your other medications Continue Xolair injections every 2 weeks . Continue avoiding fish, shellfish and eggs. If he has an allergic reaction take Benadryl 50 mg every 4 hours and if he has life-threatening symptoms inject with EpiPen 0.3 mg  Prednisone 10 mg. Take 2 tablets twice a day for 3 days, then take 2 tablets once a day for 1 day, then take 1 tablet once a day for 1 day, then stop.  Call us if you are not doing well on this treatment plan  Follow up in 2 months or sooner if needed   Return in about 2 months (around 02/15/2018), or if symptoms worsen or fail to improve.   Thank you for the opportunity to care for this patient.  Please do not hesitate to contact me with questions.  Alexander Morgan, FNP [facility]  Alexander Hodge was seen in the clinic with Dr. Shaune Leeks today.  I have provided oversight concerning Alexander Hodge' evaluation and treatment of this patient's health issues addressed during today's encounter. I agree with the assessment and therapeutic plan as outlined in the note.   Thank you for the opportunity to care for this patient.  Please do not hesitate to contact me with questions.  Penne Lash, M.D.  [facility] [address] [place], [place] [address] [phone]

## 2017-12-16 NOTE — Patient Instructions (Addendum)
You can try Allegra D every 12 hours as needed for runny nose or itchy eyes with nasal congestion. Stop Zyrtec while taking Allegra D Continue BreoEllipta  100-1 puff every 24 hours to prevent cough or wheeze Continue Spiriva Respimat 1.25 mg- one puff every 12 hours to prevent cough or wheeze Continue Theodur 300 mg-take 1 tablet every 12 hours to prevent cough or wheeze Continue Montelukast  10 mg once a day to prevent cough or wheeze Ventolin 2 puffs every 4 hours if needed or instead Xopenex one unit dose every 6 hours if needed for cough, wheeze, or shortness of breath Continue on your other medications Continue Xolair injections every 2 weeks . Continue avoiding fish, shellfish and eggs. If he has an allergic reaction take Benadryl 50 mg every 4 hours and if he has life-threatening symptoms inject with EpiPen 0.3 mg  Prednisone 10 mg. Take 2 tablets twice a day for 3 days, then take 2 tablets once a day for 1 day, then take 1 tablet once a day for 1 day, then stop.  Call us if you are not doing well on this treatment plan  Follow up in 2 months or sooner if needed

## 2017-12-26 ENCOUNTER — Ambulatory Visit (INDEPENDENT_AMBULATORY_CARE_PROVIDER_SITE_OTHER): Payer: 59

## 2017-12-26 DIAGNOSIS — J4550 Severe persistent asthma, uncomplicated: Secondary | ICD-10-CM

## 2018-01-05 ENCOUNTER — Telehealth: Payer: Self-pay

## 2018-01-05 MED ORDER — PREDNISONE 10 MG PO TABS: ORAL_TABLET | ORAL | 0 refills | Status: DC

## 2018-01-05 NOTE — Telephone Encounter (Signed)
Sent in meds 

## 2018-01-05 NOTE — Telephone Encounter (Signed)
Call in prednisone 10 mg tablets-take 2 tablets twice a day for 3 days, 2 tablets on the fourth day, one tablet on the fifth day. He is to continue on all his asthma medications and allergy medications

## 2018-01-05 NOTE — Telephone Encounter (Signed)
Pt is having a lot of wheezing using all meds as prescribed and doing nebulizer. Pt does have some congestion.  [phone]

## 2018-01-05 NOTE — Telephone Encounter (Signed)
Sent in rx and lm for pt to call us back. 

## 2018-01-07 ENCOUNTER — Encounter: Payer: Self-pay | Admitting: Allergy and Immunology

## 2018-01-07 ENCOUNTER — Ambulatory Visit (INDEPENDENT_AMBULATORY_CARE_PROVIDER_SITE_OTHER): Payer: 59 | Admitting: Allergy and Immunology

## 2018-01-07 ENCOUNTER — Ambulatory Visit: Payer: Self-pay

## 2018-01-07 VITALS — BP 130/60 | HR 96 | Temp 98.0°F | Resp 16

## 2018-01-07 DIAGNOSIS — J0100 Acute maxillary sinusitis, unspecified: Secondary | ICD-10-CM

## 2018-01-07 DIAGNOSIS — J45901 Unspecified asthma with (acute) exacerbation: Secondary | ICD-10-CM | POA: Diagnosis not present

## 2018-01-07 DIAGNOSIS — J339 Nasal polyp, unspecified: Secondary | ICD-10-CM

## 2018-01-07 DIAGNOSIS — T7800XD Anaphylactic reaction due to unspecified food, subsequent encounter: Secondary | ICD-10-CM | POA: Diagnosis not present

## 2018-01-07 DIAGNOSIS — I10 Essential (primary) hypertension: Secondary | ICD-10-CM | POA: Insufficient documentation

## 2018-01-07 DIAGNOSIS — J301 Allergic rhinitis due to pollen: Secondary | ICD-10-CM | POA: Diagnosis not present

## 2018-01-07 MED ORDER — METHYLPREDNISOLONE ACETATE 80 MG/ML IJ SUSP
80.0000 mg | Freq: Once | INTRAMUSCULAR | Status: AC
  Administered 2018-01-07: 80 mg via INTRAMUSCULAR

## 2018-01-07 NOTE — Assessment & Plan Note (Addendum)
   Depo-Medrol 80 mg was administered in the office.   Prednisone has been provided, 40 mg x3 days, 20 mg x1 day, 10 mg x1 day, then stop.  A prescription has been provided for [facility] (mometasone/formoterol) 200/5 g, 2 inhalations twice a day. To maximize pulmonary deposition, a spacer has been provided along with instructions for its proper administration with an HFA inhaler.  Discontinue Brio Ellipta.  Continue Spiriva Respimat 1.25 g, 2 inhalations daily, Theodore 300 mg twice daily, montelukast 10 mg daily bedtime, and albuterol as needed.  The patient has been asked to contact us if his symptoms persist or progress. Otherwise, he may return for follow up in 4 months.

## 2018-01-07 NOTE — Assessment & Plan Note (Signed)
   Continue appropriate allergen avoidance measures.  Treatment plan as outlined above for acute sinusitis. 

## 2018-01-07 NOTE — Progress Notes (Signed)
Follow-up Note  RE: [name] MRN: [id number] DOB: 1961-03-24 Date of Office Visit: 01/07/2018  Primary care provider: Kristopher Glee., MD Referring provider: Kristopher Glee., MD  History of present illness: Alexander Hodge is a 57 y.o. male with persistent asthma on Xolair, allergic rhinitis, history of nasal polyps, food allergy, and atopic dermatitis presenting today for sick visit.  He was last seen in this clinic on December 16, 2017 by Dr. Shaune Leeks.  He reports that over the past 2 weeks he has been experiencing dyspnea, coughing, chest tightness, and wheezing "day and night" despite compliance with his asthma medications.  These symptoms have progressed over the past 2 days.  He is also experiencing nasal congestion, sinus congestion, thick postnasal drainage, rhinorrhea, nasal pruritus, and ocular pruritus.  He denies fevers, chills, and fetid mucus production.  Assessment and plan: Asthma with acute exacerbation  Depo-Medrol 80 mg was administered in the office.   Prednisone has been provided, 40 mg x3 days, 20 mg x1 day, 10 mg x1 day, then stop.  A prescription has been provided for [facility] (mometasone/formoterol) 200/5 g,  2 inhalations twice a day. To maximize pulmonary deposition, a spacer has been provided along with instructions for its proper administration with an HFA inhaler.  Discontinue Brio Ellipta.  Continue Spiriva Respimat 1.25 g, 2 inhalations daily, Theodore 300 mg twice daily, montelukast 10 mg daily bedtime, and albuterol as needed.  The patient has been asked to contact us if his symptoms persist or progress. Otherwise, he may return for follow up in 4 months.  Acute sinusitis  Depo-Medrol and prednisone have been provided (as above).  A prescription has been provided for azelastine nasal spray, 1-2 sprays per nostril 2 times daily as needed.   Continue fluticasone nasal spray as prescribed.  Nasal saline lavage (NeilMed) has been recommended as needed  and prior to medicated nasal sprays along with instructions for proper administration.  For thick post nasal drainage, add guaifenesin 1200 mg (Mucinex Maximum Strength)  twice daily as needed with adequate hydration as discussed.  The patient has been asked to contact us if his symptoms persist, progress, or if he becomes febrile.   Allergic rhinitis due to pollen  Continue appropriate allergen avoidance measures.  Treatment plan as outlined above for acute sinusitis.  Nasal polyps  Continue fluticasone nasal spray daily.  Anaphylactic shock due to adverse food reaction  Continue meticulous avoidance of shellfish, fish, and eggs and have access to epinephrine autoinjector 2 pack in case of accidental ingestion.  Food allergy action plan is in place.   Meds ordered this encounter  Medications  . methylPREDNISolone acetate (DEPO-MEDROL) injection 80 mg    Diagnostics: Spirometry reveals an FVC of 2.20 L (33% predicted) and an FEV1 of 0.54 L (19% predicted) without postbronchodilator improvement.  Please see scanned spirometry results for details.    Physical examination: Blood pressure 130/60, pulse 96, temperature 98 F (36.7 C), temperature source Oral, resp. rate 16, SpO2 93 %.  General: Alert, interactive, with labored breathing. HEENT: TMs pearly gray, turbinates moderately edematous with thick discharge, post-pharynx moderately erythematous. Neck: Supple without lymphadenopathy. Lungs: Decreased breath sounds with expiratory wheezing bilaterally. CV: Normal S1, S2 without murmurs. Skin: Warm and dry, without lesions or rashes.  The following portions of the patient's history were reviewed and updated as appropriate: allergies, current medications, past family history, past medical history, past social history, past surgical history and problem list.  Allergies as of 01/07/2018  Reactions   Eggs Or Egg-derived Products Shortness Of Breath   Eicosapentaenoic Acid  (epa) Rash, Hives   Unknown reaction Unknown reaction   Poultry Meal Shortness Of Breath   Shellfish Allergy    Fish-derived Products Rash   Unknown reaction      Medication List        Accurate as of 01/07/18  4:08 PM. Always use your most recent med list.          albuterol 108 (90 Base) MCG/ACT inhaler Commonly known as:  VENTOLIN HFA Inhale 2 puffs into the lungs every 4 (four) hours as needed for wheezing or shortness of breath.   alendronate 70 MG tablet Commonly known as:  FOSAMAX Take 70 mg by mouth.   buPROPion 150 MG 24 hr tablet Commonly known as:  WELLBUTRIN XL Take by mouth.   cetirizine 10 MG tablet Commonly known as:  ZYRTEC Take 10 mg by mouth.   CIALIS 5 MG tablet Generic drug:  tadalafil TAKE 1 TABLET BY MOUTH  DAILY AS NEEDED FOR UP TO  30 DAYS FOR ERECTILE  DYSFUNCTION.   clobetasol cream 0.05 % Commonly known as:  TEMOVATE Apply topically.   desonide 0.05 % ointment Commonly known as:  DESOWEN Apply topically.   diphenhydramine-acetaminophen 25-500 MG Tabs tablet Commonly known as:  TYLENOL PM Take by mouth.   EPINEPHrine 0.3 mg/0.3 mL Soaj injection Commonly known as:  EPI-PEN Use as directed for severe allergic reaction   fluticasone 50 MCG/ACT nasal spray Commonly known as:  FLONASE Place into the nose.   folic acid 1 MG tablet Commonly known as:  FOLVITE TAKE 1 TABLET BY MOUTH  DAILY   hydrocortisone 2.5 % cream Use 2-4 times daily as needed   levalbuterol 1.25 MG/3ML nebulizer solution Commonly known as:  XOPENEX INHALE 1 VIAL BY NEBULIZATION EVERY 6 (SIX) HOURS AS NEEDED FOR WHEEZING.   montelukast 10 MG tablet Commonly known as:  SINGULAIR Take 1 tablet (10 mg total) by mouth at bedtime.   omalizumab 150 MG injection Commonly known as:  XOLAIR Inject 375 mg into the skin every 14 (fourteen) days.   omeprazole 40 MG capsule Commonly known as:  PRILOSEC Take 40 mg by mouth daily.   tamoxifen 20 MG  tablet Commonly known as:  NOLVADEX Take 20 mg by mouth.   tamsulosin 0.4 MG Caps capsule Commonly known as:  FLOMAX   theophylline 300 MG 12 hr tablet Commonly known as:  THEODUR Take 300 mg by mouth 2 (two) times daily.   tiotropium 18 MCG inhalation capsule Commonly known as:  SPIRIVA Place 1 capsule (18 mcg total) into inhaler and inhale daily.   traMADol 50 MG tablet Commonly known as:  ULTRAM Take 50 mg by mouth.   traZODone 50 MG tablet Commonly known as:  DESYREL Take by mouth.   triamcinolone ointment 0.5 % Commonly known as:  KENALOG Use 2-4 times daily for 2 weeks on affected areas       Allergies  Allergen Reactions  . Eggs Or Egg-Derived Products Shortness Of Breath  . Eicosapentaenoic Acid (Epa) Rash and Hives    Unknown reaction Unknown reaction  . Poultry Meal Shortness Of Breath  . Shellfish Allergy   . Fish-Derived Products Rash    Unknown reaction   Review of systems: Review of systems negative except as noted in HPI / PMHx or noted below: Constitutional: Negative.  HENT: Negative.   Eyes: Negative.  Respiratory: Negative.   Cardiovascular: Negative.  Gastrointestinal: Negative.  Genitourinary: Negative.  Musculoskeletal: Negative.  Neurological: Negative.  Endo/Heme/Allergies: Negative.  Cutaneous: Negative.  Past Medical History:  Diagnosis Date  . Asthma     Family History  Problem Relation Age of Onset  . Asthma Mother   . Allergic rhinitis Mother   . Asthma Father   . Allergic rhinitis Father   . Asthma Sister   . Allergic rhinitis Sister   . Asthma Brother   . Allergic rhinitis Brother   . Atopy Paternal Grandfather   . Angioedema Neg Hx   . Eczema Neg Hx   . Immunodeficiency Neg Hx   . Urticaria Neg Hx     Social History   Socioeconomic History  . Marital status: Married    Spouse name: Not on file  . Number of children: Not on file  . Years of education: Not on file  . Highest education level: Not on file   Occupational History  . Not on file  Social Needs  . Financial resource strain: Not on file  . Food insecurity:    Worry: Not on file    Inability: Not on file  . Transportation needs:    Medical: Not on file    Non-medical: Not on file  Tobacco Use  . Smoking status: Never Smoker  . Smokeless tobacco: Never Used  Substance and Sexual Activity  . Alcohol use: Yes    Alcohol/week: 0.0 oz    Comment: social drinker   . Drug use: No  . Sexual activity: Not on file  Lifestyle  . Physical activity:    Days per week: Not on file    Minutes per session: Not on file  . Stress: Not on file  Relationships  . Social connections:    Talks on phone: Not on file    Gets together: Not on file    Attends religious service: Not on file    Active member of club or organization: Not on file    Attends meetings of clubs or organizations: Not on file    Relationship status: Not on file  . Intimate partner violence:    Fear of current or ex partner: Not on file    Emotionally abused: Not on file    Physically abused: Not on file    Forced sexual activity: Not on file  Other Topics Concern  . Not on file  Social History Narrative  . Not on file    I appreciate the opportunity to take part in Ernan's care. Please do not hesitate to contact me with questions.  Sincerely,   R. Edgar Frisk, MD  Addendum: The patient was given  Xopenex/Atrovent neb x1, Xopenex neb x2, and DepoMedrol 80 mcg. His respiratory distress persisted/progressed so EMS was called and he was taken to [facility] emergency department for further evaluation and treatment. See scanned treatment sheet for details.

## 2018-01-07 NOTE — Assessment & Plan Note (Signed)
   Continue fluticasone nasal spray daily. 

## 2018-01-07 NOTE — Assessment & Plan Note (Signed)
   Continue meticulous avoidance of shellfish, fish, and eggs and have access to epinephrine autoinjector 2 pack in case of accidental ingestion.  Food allergy action plan is in place.

## 2018-01-07 NOTE — Assessment & Plan Note (Addendum)
   Depo-Medrol and prednisone have been provided (as above).  A prescription has been provided for azelastine nasal spray, 1-2 sprays per nostril 2 times daily as needed.   Continue fluticasone nasal spray as prescribed.  Nasal saline lavage (NeilMed) has been recommended as needed and prior to medicated nasal sprays along with instructions for proper administration.  For thick post nasal drainage, add guaifenesin 1200 mg (Mucinex Maximum Strength)  twice daily as needed with adequate hydration as discussed.  The patient has been asked to contact us if his symptoms persist, progress, or if he becomes febrile.

## 2018-01-07 NOTE — Patient Instructions (Addendum)
Asthma with acute exacerbation  Depo-Medrol 80 mg was administered in the office.   Prednisone has been provided, 40 mg x3 days, 20 mg x1 day, 10 mg x1 day, then stop.  A prescription has been provided for [facility] (mometasone/formoterol) 200/5 g,  2 inhalations twice a day. To maximize pulmonary deposition, a spacer has been provided along with instructions for its proper administration with an HFA inhaler.  Discontinue Brio Ellipta.  Continue Spiriva Respimat 1.25 g, 2 inhalations daily, Theodore 300 mg twice daily, montelukast 10 mg daily bedtime, and albuterol as needed.  The patient has been asked to contact us if his symptoms persist or progress. Otherwise, he may return for follow up in 4 months.  Acute sinusitis  Depo-Medrol and prednisone have been provided (as above).  A prescription has been provided for azelastine nasal spray, 1-2 sprays per nostril 2 times daily as needed.   Continue fluticasone nasal spray as prescribed.  Nasal saline lavage (NeilMed) has been recommended as needed and prior to medicated nasal sprays along with instructions for proper administration.  For thick post nasal drainage, add guaifenesin 1200 mg (Mucinex Maximum Strength)  twice daily as needed with adequate hydration as discussed.  The patient has been asked to contact us if his symptoms persist, progress, or if he becomes febrile.   Allergic rhinitis due to pollen  Continue appropriate allergen avoidance measures.  Treatment plan as outlined above for acute sinusitis.  Nasal polyps  Continue fluticasone nasal spray daily.  Anaphylactic shock due to adverse food reaction  Continue meticulous avoidance of shellfish, fish, and eggs and have access to epinephrine autoinjector 2 pack in case of accidental ingestion.  Food allergy action plan is in place.   Return in about 1 month (around 02/04/2018), or if symptoms worsen or fail to improve.

## 2018-01-09 ENCOUNTER — Ambulatory Visit: Payer: Self-pay

## 2018-01-28 ENCOUNTER — Ambulatory Visit (INDEPENDENT_AMBULATORY_CARE_PROVIDER_SITE_OTHER): Payer: 59 | Admitting: *Deleted

## 2018-01-28 DIAGNOSIS — J4540 Moderate persistent asthma, uncomplicated: Secondary | ICD-10-CM

## 2018-01-29 ENCOUNTER — Telehealth: Payer: Self-pay | Admitting: *Deleted

## 2018-01-29 NOTE — Telephone Encounter (Signed)
Spoke with pts wife to let her know that [employer] forms are ready to be picked up.

## 2018-02-09 ENCOUNTER — Ambulatory Visit (INDEPENDENT_AMBULATORY_CARE_PROVIDER_SITE_OTHER): Payer: 59

## 2018-02-09 DIAGNOSIS — J4540 Moderate persistent asthma, uncomplicated: Secondary | ICD-10-CM

## 2018-02-25 ENCOUNTER — Encounter: Payer: Self-pay | Admitting: Allergy and Immunology

## 2018-02-25 ENCOUNTER — Ambulatory Visit (INDEPENDENT_AMBULATORY_CARE_PROVIDER_SITE_OTHER): Payer: 59 | Admitting: Allergy and Immunology

## 2018-02-25 VITALS — BP 124/66 | HR 100 | Temp 97.9°F | Resp 20 | Ht 67.0 in | Wt 174.8 lb

## 2018-02-25 DIAGNOSIS — J4551 Severe persistent asthma with (acute) exacerbation: Secondary | ICD-10-CM

## 2018-02-25 DIAGNOSIS — R040 Epistaxis: Secondary | ICD-10-CM | POA: Diagnosis not present

## 2018-02-25 DIAGNOSIS — T7800XD Anaphylactic reaction due to unspecified food, subsequent encounter: Secondary | ICD-10-CM

## 2018-02-25 DIAGNOSIS — J3089 Other allergic rhinitis: Secondary | ICD-10-CM

## 2018-02-25 DIAGNOSIS — L989 Disorder of the skin and subcutaneous tissue, unspecified: Secondary | ICD-10-CM

## 2018-02-25 DIAGNOSIS — J0100 Acute maxillary sinusitis, unspecified: Secondary | ICD-10-CM | POA: Diagnosis not present

## 2018-02-25 DIAGNOSIS — J339 Nasal polyp, unspecified: Secondary | ICD-10-CM

## 2018-02-25 MED ORDER — MOMETASONE FURO-FORMOTEROL FUM 200-5 MCG/ACT IN AERO: INHALATION_SPRAY | RESPIRATORY_TRACT | 5 refills | Status: DC

## 2018-02-25 MED ORDER — ALBUTEROL SULFATE HFA 108 (90 BASE) MCG/ACT IN AERS: 2.0000 | INHALATION_SPRAY | RESPIRATORY_TRACT | 3 refills | Status: DC | PRN

## 2018-02-25 MED ORDER — THEOPHYLLINE ER 300 MG PO TB12: 300.0000 mg | ORAL_TABLET | Freq: Two times a day (BID) | ORAL | 5 refills | Status: DC

## 2018-02-25 MED ORDER — AZELASTINE HCL 0.15 % NA SOLN: NASAL | 5 refills | Status: DC

## 2018-02-25 NOTE — Assessment & Plan Note (Addendum)
   Continue appropriate allergen avoidance measures, montelukast 10 mg daily, and azelastine nasal spray as needed.  Nasal saline spray (i.e., Simply Saline) or nasal saline lavage (i.e., NeilMed) is recommended as needed and prior to medicated nasal sprays.  Patient may benefit from aeroallergen immunotherapy, however given the severity of his asthma it may be challenging to navigate buildup injections despite being on Xolair.

## 2018-02-25 NOTE — Assessment & Plan Note (Addendum)
   Given the need for an intranasal steroid to prevent the progression/recurrence of nasal polyps, in the context of epistaxis and anemia, follow-up with otolaryngologist is recommended.

## 2018-02-25 NOTE — Assessment & Plan Note (Signed)
   Continue meticulous avoidance of shellfish, fish, and eggs and have access to epinephrine autoinjector 2 pack in case of accidental ingestion. 

## 2018-02-25 NOTE — Progress Notes (Signed)
Follow-up Note  RE: GIAVANNI ODONOVAN MRN: [id number] DOB: August 13, 1961 Date of Office Visit: 02/25/2018  Primary care provider: Kristopher Glee., MD Referring provider: Kristopher Glee., MD  History of present illness: Alexander Hodge is a 57 y.o. male with severe persistent asthma on Xolair, allergic rhinitis, history of nasal polyps, food allergy, and atopic dermatitis presenting today for a sick visit.  He was last seen in this clinic on January 07, 2018 for an asthma exacerbation.  Warts that over the past 24 hours he has experienced frequent wheezing requiring frequent albuterol treatments.  He is also experiencing nasal congestion, thick postnasal drainage, and sinus pressure.  He is not experiencing fevers.  He attributes the exacerbation of his lower and upper respiratory symptoms to increased humidity and pollen exposure. He also complains of frequent epistaxis.  He has seen otolaryngology in the past, however but not for this problem apparently.  Vittorio complains of a bump at the end of his nose which he initially noticed 2 or 3 months ago.  The bump has continued to grow in size and has begun to bleed if he touches it or rubs his nose.  Assessment and plan: Severe persistent asthma  Prednisone has been provided, 40 mg x3 days, 20 mg x1 day, 10 mg x1 day, then stop.  A prescription has been provided for [facility] (mometasone/formoterol) 200/5 g,  2 inhalations twice a day. To maximize pulmonary deposition, a spacer has been provided along with instructions for its proper administration with an HFA inhaler.  Discontinue Brio Ellipta.  Continue Spiriva Respimat 1.25 mg, 2 inhalations daily, montelukast 10 mg daily, and albuterol every 4-6 hours if needed.  Resume omalizumab (Xolair) injections after this exacerbation has resolved.  The patient has been asked to contact me if his symptoms persist or progress. Otherwise, he may return for follow up in 4 months.  Acute sinusitis  Prednisone  has been provided (as above).  A prescription has been provided for azelastine nasal spray, 1-2 sprays per nostril 2 times daily as needed.   Continue fluticasone nasal spray as prescribed.  Nasal saline lavage (NeilMed) has been recommended as needed and prior to medicated nasal sprays along with instructions for proper administration.  For thick post nasal drainage, add guaifenesin 1200 mg (Mucinex Maximum Strength)  twice daily as needed with adequate hydration as discussed.  The patient has been asked to contact us if his symptoms persist, progress, or if he becomes febrile.   Nasal polyps  Given the need for an intranasal steroid to prevent the progression/recurrence of nasal polyps, in the context of epistaxis and anemia, follow-up with otolaryngologist is recommended.  Other allergic rhinitis  Continue appropriate allergen avoidance measures, montelukast 10 mg daily, and azelastine nasal spray as needed.  Nasal saline spray (i.e., Simply Saline) or nasal saline lavage (i.e., NeilMed) is recommended as needed and prior to medicated nasal sprays.  Patient may benefit from aeroallergen immunotherapy, however given the severity of his asthma it may be challenging to navigate buildup injections despite being on Xolair.  Non-healing skin lesion of nose  Follow-up with primary care physician and/or dermatologist for further evaluation.  Food allergy  Continue meticulous avoidance of shellfish, fish, and eggs and have access to epinephrine autoinjector 2 pack in case of accidental ingestion.   Meds ordered this encounter  Medications  . mometasone-formoterol (DULERA) 200-5 MCG/ACT AERO    Sig: Two puffs with spacer twice a day    Dispense:  13  g    Refill:  5  . Azelastine HCl 0.15 % SOLN    Sig: 1-2 sprays each nostril twice a day as needed    Dispense:  30 mL    Refill:  5    Diagnostics: Spirometry reveals an FVC of 3.22 L (89% predicted) and an FEV1 of 1.98 L (70%  predicted) without significant postbronchodilator improvement.  Please see scanned spirometry results for details.    Physical examination: Blood pressure 124/66, pulse 100, temperature 97.9 F (36.6 C), temperature source Oral, resp. rate 20, height 5\' 7"  (1.702 m), weight 174 lb 12.8 oz (79.3 kg), SpO2 96 %.  General: Alert, interactive, in no acute distress. HEENT: TMs pearly gray, turbinates moderately edematous with crusty discharge, post-pharynx moderately erythematous. Neck: Supple without lymphadenopathy. Lungs: Mildly decreased breath sounds bilaterally without wheezing, rhonchi or rales. CV: Normal S1, S2 without murmurs. Skin: Warm and dry, without lesions or rashes.  The following portions of the patient's history were reviewed and updated as appropriate: allergies, current medications, past family history, past medical history, past social history, past surgical history and problem list.  Allergies as of 02/25/2018      Reactions   Eggs Or Egg-derived Products Shortness Of Breath   Eicosapentaenoic Acid (epa) Rash, Hives   Unknown reaction Unknown reaction   Poultry Meal Shortness Of Breath   Shellfish Allergy    Fish-derived Products Rash   Unknown reaction      Medication List        Accurate as of 02/25/18  1:12 PM. Always use your most recent med list.          albuterol 108 (90 Base) MCG/ACT inhaler Commonly known as:  VENTOLIN HFA Inhale 2 puffs into the lungs every 4 (four) hours as needed for wheezing or shortness of breath.   alendronate 70 MG tablet Commonly known as:  FOSAMAX Take 70 mg by mouth.   Azelastine HCl 0.15 % Soln 1-2 sprays each nostril twice a day as needed   BREO ELLIPTA 200-25 MCG/INH Aepb Generic drug:  fluticasone furoate-vilanterol Inhale 1 puff into the lungs 2 (two) times daily.   buPROPion 150 MG 24 hr tablet Commonly known as:  WELLBUTRIN XL Take by mouth.   cetirizine 10 MG tablet Commonly known as:  ZYRTEC Take 10 mg  by mouth.   CIALIS 5 MG tablet Generic drug:  tadalafil TAKE 1 TABLET BY MOUTH  DAILY AS NEEDED FOR UP TO  30 DAYS FOR ERECTILE  DYSFUNCTION.   clobetasol cream 0.05 % Commonly known as:  TEMOVATE Apply topically.   desonide 0.05 % ointment Commonly known as:  DESOWEN Apply topically.   diphenhydramine-acetaminophen 25-500 MG Tabs tablet Commonly known as:  TYLENOL PM Take by mouth.   EPINEPHrine 0.3 mg/0.3 mL Soaj injection Commonly known as:  EPI-PEN Use as directed for severe allergic reaction   fluticasone 50 MCG/ACT nasal spray Commonly known as:  FLONASE Place into the nose.   folic acid 1 MG tablet Commonly known as:  FOLVITE TAKE 1 TABLET BY MOUTH  DAILY   hydrocortisone 2.5 % cream Use 2-4 times daily as needed   levalbuterol 1.25 MG/3ML nebulizer solution Commonly known as:  XOPENEX INHALE 1 VIAL BY NEBULIZATION EVERY 6 (SIX) HOURS AS NEEDED FOR WHEEZING.   mometasone-formoterol 200-5 MCG/ACT Aero Commonly known as:  DULERA Two puffs with spacer twice a day   montelukast 10 MG tablet Commonly known as:  SINGULAIR Take 1 tablet (10 mg total) by  mouth at bedtime.   omalizumab 150 MG injection Commonly known as:  XOLAIR Inject 375 mg into the skin every 14 (fourteen) days.   omeprazole 40 MG capsule Commonly known as:  PRILOSEC Take 40 mg by mouth daily.   tamoxifen 20 MG tablet Commonly known as:  NOLVADEX Take 20 mg by mouth.   tamsulosin 0.4 MG Caps capsule Commonly known as:  FLOMAX   theophylline 300 MG 12 hr tablet Commonly known as:  THEODUR Take 300 mg by mouth 2 (two) times daily.   tiotropium 18 MCG inhalation capsule Commonly known as:  SPIRIVA Place 1 capsule (18 mcg total) into inhaler and inhale daily.   traMADol 50 MG tablet Commonly known as:  ULTRAM Take 50 mg by mouth.   traZODone 50 MG tablet Commonly known as:  DESYREL Take by mouth.   triamcinolone ointment 0.5 % Commonly known as:  KENALOG Use 2-4 times daily  for 2 weeks on affected areas       Allergies  Allergen Reactions  . Eggs Or Egg-Derived Products Shortness Of Breath  . Eicosapentaenoic Acid (Epa) Rash and Hives    Unknown reaction Unknown reaction  . Poultry Meal Shortness Of Breath  . Shellfish Allergy   . Fish-Derived Products Rash    Unknown reaction   Review of systems: Review of systems negative except as noted in HPI / PMHx or noted below: Constitutional: Negative.  HENT: Negative.   Eyes: Negative.  Respiratory: Negative.   Cardiovascular: Negative.  Gastrointestinal: Negative.  Genitourinary: Negative.  Musculoskeletal: Negative.  Neurological: Negative.  Endo/Heme/Allergies: Negative.  Cutaneous: Negative.  Past Medical History:  Diagnosis Date  . Asthma     Family History  Problem Relation Age of Onset  . Asthma Mother   . Allergic rhinitis Mother   . Asthma Father   . Allergic rhinitis Father   . Asthma Sister   . Allergic rhinitis Sister   . Asthma Brother   . Allergic rhinitis Brother   . Atopy Paternal Grandfather   . Angioedema Neg Hx   . Eczema Neg Hx   . Immunodeficiency Neg Hx   . Urticaria Neg Hx     Social History   Socioeconomic History  . Marital status: Married    Spouse name: Not on file  . Number of children: Not on file  . Years of education: Not on file  . Highest education level: Not on file  Occupational History  . Not on file  Social Needs  . Financial resource strain: Not on file  . Food insecurity:    Worry: Not on file    Inability: Not on file  . Transportation needs:    Medical: Not on file    Non-medical: Not on file  Tobacco Use  . Smoking status: Never Smoker  . Smokeless tobacco: Never Used  Substance and Sexual Activity  . Alcohol use: Yes    Alcohol/week: 0.0 oz    Comment: social drinker   . Drug use: No  . Sexual activity: Not on file  Lifestyle  . Physical activity:    Days per week: Not on file    Minutes per session: Not on file  .  Stress: Not on file  Relationships  . Social connections:    Talks on phone: Not on file    Gets together: Not on file    Attends religious service: Not on file    Active member of club or organization: Not on file  Attends meetings of clubs or organizations: Not on file    Relationship status: Not on file  . Intimate partner violence:    Fear of current or ex partner: Not on file    Emotionally abused: Not on file    Physically abused: Not on file    Forced sexual activity: Not on file  Other Topics Concern  . Not on file  Social History Narrative  . Not on file    I appreciate the opportunity to take part in Kavari's care. Please do not hesitate to contact me with questions.  Sincerely,   R. Edgar Frisk, MD

## 2018-02-25 NOTE — Assessment & Plan Note (Signed)
   Follow-up with primary care physician and/or dermatologist for further evaluation.

## 2018-02-25 NOTE — Addendum Note (Signed)
Addended byOralia Rud M on: 02/25/2018 05:02 PM   Modules accepted: Orders

## 2018-02-25 NOTE — Assessment & Plan Note (Signed)
   Prednisone has been provided (as above).  A prescription has been provided for azelastine nasal spray, 1-2 sprays per nostril 2 times daily as needed.   Continue fluticasone nasal spray as prescribed.  Nasal saline lavage (NeilMed) has been recommended as needed and prior to medicated nasal sprays along with instructions for proper administration.  For thick post nasal drainage, add guaifenesin 1200 mg (Mucinex Maximum Strength)  twice daily as needed with adequate hydration as discussed.  The patient has been asked to contact us if his symptoms persist, progress, or if he becomes febrile.

## 2018-02-25 NOTE — Patient Instructions (Addendum)
Severe persistent asthma  Prednisone has been provided, 40 mg x3 days, 20 mg x1 day, 10 mg x1 day, then stop.  A prescription has been provided for [facility] (mometasone/formoterol) 200/5 g,  2 inhalations twice a day. To maximize pulmonary deposition, a spacer has been provided along with instructions for its proper administration with an HFA inhaler.  Discontinue Brio Ellipta.  Continue Spiriva Respimat 1.25 mg, 2 inhalations daily, montelukast 10 mg daily, and albuterol every 4-6 hours if needed.  Resume omalizumab (Xolair) injections after this exacerbation has resolved.  The patient has been asked to contact me if his symptoms persist or progress. Otherwise, he may return for follow up in 4 months.  Acute sinusitis  Prednisone has been provided (as above).  A prescription has been provided for azelastine nasal spray, 1-2 sprays per nostril 2 times daily as needed.   Continue fluticasone nasal spray as prescribed.  Nasal saline lavage (NeilMed) has been recommended as needed and prior to medicated nasal sprays along with instructions for proper administration.  For thick post nasal drainage, add guaifenesin 1200 mg (Mucinex Maximum Strength)  twice daily as needed with adequate hydration as discussed.  The patient has been asked to contact us if his symptoms persist, progress, or if he becomes febrile.   Nasal polyps  Given the need for an intranasal steroid to prevent the progression/recurrence of nasal polyps, in the context of epistaxis and anemia, follow-up with otolaryngologist is recommended.  Other allergic rhinitis  Continue appropriate allergen avoidance measures, montelukast 10 mg daily, and azelastine nasal spray as needed.  Nasal saline spray (i.e., Simply Saline) or nasal saline lavage (i.e., NeilMed) is recommended as needed and prior to medicated nasal sprays.  Patient may benefit from aeroallergen immunotherapy, however given the severity of his asthma it may  be challenging to navigate buildup injections despite being on Xolair.  Non-healing skin lesion of nose  Follow-up with primary care physician and/or dermatologist for further evaluation.  Food allergy  Continue meticulous avoidance of shellfish, fish, and eggs and have access to epinephrine autoinjector 2 pack in case of accidental ingestion.   Return in about 4 months (around 06/27/2018), or if symptoms worsen or fail to improve.

## 2018-02-25 NOTE — Assessment & Plan Note (Addendum)
   Prednisone has been provided, 40 mg x3 days, 20 mg x1 day, 10 mg x1 day, then stop.  A prescription has been provided for [facility] (mometasone/formoterol) 200/5 g, 2 inhalations twice a day. To maximize pulmonary deposition, a spacer has been provided along with instructions for its proper administration with an HFA inhaler.  Discontinue Brio Ellipta.  Continue Spiriva Respimat 1.25 mg, 2 inhalations daily, montelukast 10 mg daily, and albuterol every 4-6 hours if needed.  Resume omalizumab (Xolair) injections after this exacerbation has resolved.  The patient has been asked to contact me if his symptoms persist or progress. Otherwise, he may return for follow up in 4 months.

## 2018-02-27 ENCOUNTER — Ambulatory Visit (INDEPENDENT_AMBULATORY_CARE_PROVIDER_SITE_OTHER): Payer: 59

## 2018-02-27 ENCOUNTER — Other Ambulatory Visit: Payer: Self-pay | Admitting: Allergy and Immunology

## 2018-02-27 DIAGNOSIS — J4540 Moderate persistent asthma, uncomplicated: Secondary | ICD-10-CM

## 2018-03-16 ENCOUNTER — Ambulatory Visit (INDEPENDENT_AMBULATORY_CARE_PROVIDER_SITE_OTHER): Payer: 59

## 2018-03-16 DIAGNOSIS — J4540 Moderate persistent asthma, uncomplicated: Secondary | ICD-10-CM

## 2018-03-31 ENCOUNTER — Ambulatory Visit (INDEPENDENT_AMBULATORY_CARE_PROVIDER_SITE_OTHER): Payer: 59

## 2018-03-31 DIAGNOSIS — J4540 Moderate persistent asthma, uncomplicated: Secondary | ICD-10-CM

## 2018-04-14 ENCOUNTER — Ambulatory Visit (INDEPENDENT_AMBULATORY_CARE_PROVIDER_SITE_OTHER): Payer: 59

## 2018-04-14 DIAGNOSIS — J4540 Moderate persistent asthma, uncomplicated: Secondary | ICD-10-CM

## 2018-04-28 ENCOUNTER — Ambulatory Visit (INDEPENDENT_AMBULATORY_CARE_PROVIDER_SITE_OTHER): Payer: 59 | Admitting: *Deleted

## 2018-04-28 DIAGNOSIS — J4540 Moderate persistent asthma, uncomplicated: Secondary | ICD-10-CM | POA: Diagnosis not present

## 2018-05-12 ENCOUNTER — Ambulatory Visit (INDEPENDENT_AMBULATORY_CARE_PROVIDER_SITE_OTHER): Payer: 59 | Admitting: *Deleted

## 2018-05-12 DIAGNOSIS — J4540 Moderate persistent asthma, uncomplicated: Secondary | ICD-10-CM

## 2018-05-21 ENCOUNTER — Telehealth: Payer: Self-pay | Admitting: *Deleted

## 2018-05-21 NOTE — Telephone Encounter (Signed)
Correction "Dr. Shaune Leeks"

## 2018-05-21 NOTE — Telephone Encounter (Signed)
Alexander Hodge is calling wanting his FMLA forms changed again. The alotted time was 10 times annually and he states he needs it to be weekly. He said he was up all night with his breathing and that he can't go to work on no sleep. He said if he exceeds the limit on those forms he can be fired. He said if he needed to come in for an apt to speak with Dr. Granville Lewis about this he can. Please advise.

## 2018-05-26 ENCOUNTER — Ambulatory Visit: Payer: 59

## 2018-05-26 NOTE — Telephone Encounter (Signed)
Lets go ahead and update his FMLA forms.  Fill them out like before except for changing exacerbations from 10 times per year to weekly

## 2018-05-28 NOTE — Telephone Encounter (Signed)
Pt called back and he will bring in new forms tomorrow at 1:30 when he comes for his allergy shot. He would like for the doctor in clinic to listen to him to make sure he is okay to get his allergy injections because he has been wheezing.

## 2018-05-28 NOTE — Telephone Encounter (Signed)
LVM to return call.

## 2018-06-01 ENCOUNTER — Encounter: Payer: Self-pay | Admitting: Pediatrics

## 2018-06-01 ENCOUNTER — Ambulatory Visit (INDEPENDENT_AMBULATORY_CARE_PROVIDER_SITE_OTHER): Payer: 59 | Admitting: Pediatrics

## 2018-06-01 VITALS — BP 106/56 | HR 80 | Temp 98.2°F | Resp 16

## 2018-06-01 DIAGNOSIS — G4733 Obstructive sleep apnea (adult) (pediatric): Secondary | ICD-10-CM

## 2018-06-01 DIAGNOSIS — D681 Hereditary factor XI deficiency: Secondary | ICD-10-CM | POA: Diagnosis not present

## 2018-06-01 DIAGNOSIS — K219 Gastro-esophageal reflux disease without esophagitis: Secondary | ICD-10-CM

## 2018-06-01 DIAGNOSIS — J4551 Severe persistent asthma with (acute) exacerbation: Secondary | ICD-10-CM

## 2018-06-01 DIAGNOSIS — J4550 Severe persistent asthma, uncomplicated: Secondary | ICD-10-CM

## 2018-06-01 DIAGNOSIS — R040 Epistaxis: Secondary | ICD-10-CM

## 2018-06-01 DIAGNOSIS — T7800XD Anaphylactic reaction due to unspecified food, subsequent encounter: Secondary | ICD-10-CM

## 2018-06-01 MED ORDER — TIOTROPIUM BROMIDE MONOHYDRATE 1.25 MCG/ACT IN AERS: 2.0000 | INHALATION_SPRAY | Freq: Every day | RESPIRATORY_TRACT | 5 refills | Status: DC

## 2018-06-01 MED ORDER — THEOPHYLLINE ER 300 MG PO TB12: ORAL_TABLET | ORAL | 1 refills | Status: DC

## 2018-06-01 MED ORDER — MONTELUKAST SODIUM 10 MG PO TABS: ORAL_TABLET | ORAL | 3 refills | Status: DC

## 2018-06-01 MED ORDER — ALBUTEROL SULFATE HFA 108 (90 BASE) MCG/ACT IN AERS: 2.0000 | INHALATION_SPRAY | RESPIRATORY_TRACT | 1 refills | Status: DC | PRN

## 2018-06-01 MED ORDER — LEVALBUTEROL HCL 1.25 MG/3ML IN NEBU: INHALATION_SOLUTION | RESPIRATORY_TRACT | 1 refills | Status: DC

## 2018-06-01 MED ORDER — MOMETASONE FURO-FORMOTEROL FUM 200-5 MCG/ACT IN AERO: INHALATION_SPRAY | RESPIRATORY_TRACT | 5 refills | Status: DC

## 2018-06-01 MED ORDER — FLUTICASONE PROPIONATE 50 MCG/ACT NA SUSP: NASAL | 5 refills | Status: DC

## 2018-06-01 NOTE — Progress Notes (Signed)
[address] [place] [address] Dept: [phone]  FOLLOW UP NOTE  Patient ID: Alexander Hodge, male    DOB: 1961-07-26  Age: 57 y.o. MRN: [id number] Date of Office Visit: 06/01/2018  Assessment  Chief Complaint: Allergic Rhinitis ; Asthma; Nasal Congestion (yellowish color last week); Epistaxis; and Wheezing (for the las couple of weeks)  HPI Alexander Hodge presents for follow-up of asthma, allergic rhinitis and food allergies.  He is on Xolair every 2 weeks.  He has had more nasal congestion and coughing over the past week or so.  He may have had a cold last week He developed a nose bleed while waiting to be seen.  His current asthma medications are outlined in the after visit summary.  He continues to avoid egg, fish and shellfish.  He can eat chicken without any problems.  The last time he needed prednisone for 5 days for uncontrolled asthma was 3 months ago  Other current medications are outlined in the chart   Drug Allergies:  Allergies  Allergen Reactions  . Eggs Or Egg-Derived Products Shortness Of Breath  . Eicosapentaenoic Acid (Epa) Rash and Hives    Unknown reaction Unknown reaction  . Poultry Meal Shortness Of Breath  . Fish Allergy Other (See Comments)    Unknown reaction Unknown reaction  . Shellfish Allergy   . Fish-Derived Products Rash    Unknown reaction    Physical Exam: BP (!) 106/56 (BP Location: Left Arm, Patient Position: Sitting, Cuff Size: Normal)   Pulse 80   Temp 98.2 F (36.8 C) (Oral)   Resp 16   SpO2 98%    Physical Exam  Constitutional: He is oriented to person, place, and time. He appears well-developed and well-nourished.  HENT:  Eyes normal.  Ears normal.  Nose showed a small area in the right nostril that had some pinpoint bleeding.  Pharynx normal.  Neck: Neck supple.  Cardiovascular:  S1-S2 normal no murmur  Pulmonary/Chest:  Clear to percussion and auscultation  Lymphadenopathy:    He has no cervical adenopathy.    Neurological: He is alert and oriented to person, place, and time.  Psychiatric: He has a normal mood and affect. His behavior is normal. Judgment and thought content normal.  Vitals reviewed.   Diagnostics: FVC 2.53 L FEV1 1.34 L.  Predicted FVC 3.62 L predicted FEV1 2.84 L-this shows a mild reduction in the forced vital capacity and a moderate reduction in the FEV1  Assessment and Plan: 1. Severe persistent asthma without complication   2. Obstructive sleep apnea   3. 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 Right-sided nosebleed   6. Gastroesophageal reflux disease without esophagitis   7. Severe persistent asthma with acute exacerbation     Meds ordered this encounter  Medications  . albuterol (VENTOLIN HFA) 108 (90 Base) MCG/ACT inhaler    Sig: Inhale 2 puffs into the lungs every 4 (four) hours as needed for wheezing or shortness of breath.    Dispense:  1 Inhaler    Refill:  1  . fluticasone (FLONASE) 50 MCG/ACT nasal spray    Sig: 1 spray per nostril twice daily if needed for stuffy nose.    Dispense:  1 g    Refill:  5  . mometasone-formoterol (DULERA) 200-5 MCG/ACT AERO    Sig: 2 puffs twice a day to prevent coughing or wheezing.    Dispense:  1 Inhaler  Refill:  5  . levalbuterol (XOPENEX) 1.25 MG/3ML nebulizer solution    Sig: INHALE 1 VIAL BY NEBULIZATION EVERY 6 (SIX) HOURS AS NEEDED FOR WHEEZING.    Dispense:  72 mL    Refill:  1  . Tiotropium Bromide Monohydrate (SPIRIVA RESPIMAT) 1.25 MCG/ACT AERS    Sig: Inhale 2 puffs into the lungs daily. To help the breathing.    Dispense:  1 Inhaler    Refill:  5  . theophylline (THEODUR) 300 MG 12 hr tablet    Sig: Take 1 tablet every 12 hours for coughing or wheezing.    Dispense:  180 tablet    Refill:  1    Please dispense 90 day supply.  . montelukast (SINGULAIR) 10 MG tablet    Sig: Take 1 tablet at night for coughing or wheezing    Dispense:  90 tablet     Refill:  3    Please dispense 90 day supply.    Patient Instructions  Zyrtec 10 mg-take 1 tablet once a day if needed for runny nose or itchy eyes Fluticasone 1 spray per nostril twice a day if needed for stuffy nose Dulera 200-5- 2 puffs every 12 hours to prevent coughing or wheezing Montelukast 10 mg-take 1 tablet at night to prevent coughing or wheezing Ventolin 2 puffs every 4 hours if needed for wheezing or coughing spells or instead Xopenex 1.25 mg - 1 unit dose every 6 hours if needed Spiriva Respimat 1.25- 2 puffs every 24 hours to help the breathing Theophylline 300 mg tablet-take 1 tablet every 12 hours for coughing or wheezing Continue  Xolair 375 mg every 2 weeks Add prednisone 20 mg twice a day for 3 days, 20 mg on the fourth day, 10 mg on the fifth day to bring your asthma under control If you have a nose bleed, try Afrin pediatric nose drops-2 or 3 drops in the nostril with the  nosebleed and  apply pressure Continue on your other medications  Avoid eggs, fish and shellfish.  If you have an allergic reaction take Benadryl 50 mg every 4 hours and if you have life-threatening symptoms inject with EpiPen 0.3 mg    Return in about 3 months (around 08/31/2018).    Thank you for the opportunity to care for this patient.  Please do not hesitate to contact me with questions.  Penne Lash, M.D.  [facility] [address] [place], [place] [address] [phone]

## 2018-06-01 NOTE — Patient Instructions (Addendum)
Zyrtec 10 mg-take 1 tablet once a day if needed for runny nose or itchy eyes Fluticasone 1 spray per nostril twice a day if needed for stuffy nose Dulera 200-5- 2 puffs every 12 hours to prevent coughing or wheezing Montelukast 10 mg-take 1 tablet at night to prevent coughing or wheezing Ventolin 2 puffs every 4 hours if needed for wheezing or coughing spells or instead Xopenex 1.25 mg - 1 unit dose every 6 hours if needed Spiriva Respimat 1.25- 2 puffs every 24 hours to help the breathing Theophylline 300 mg tablet-take 1 tablet every 12 hours for coughing or wheezing Continue  Xolair 375 mg every 2 weeks Add prednisone 20 mg twice a day for 3 days, 20 mg on the fourth day, 10 mg on the fifth day to bring your asthma under control If you have a nose bleed, try Afrin pediatric nose drops-2 or 3 drops in the nostril with the  nosebleed and  apply pressure Continue on your other medications  Avoid eggs, fish and shellfish.  If you have an allergic reaction take Benadryl 50 mg every 4 hours and if you have life-threatening symptoms inject with EpiPen 0.3 mg

## 2018-06-10 ENCOUNTER — Ambulatory Visit (INDEPENDENT_AMBULATORY_CARE_PROVIDER_SITE_OTHER): Payer: 59

## 2018-06-10 DIAGNOSIS — J4540 Moderate persistent asthma, uncomplicated: Secondary | ICD-10-CM | POA: Diagnosis not present

## 2018-06-24 ENCOUNTER — Ambulatory Visit (INDEPENDENT_AMBULATORY_CARE_PROVIDER_SITE_OTHER): Payer: 59

## 2018-06-24 DIAGNOSIS — J4540 Moderate persistent asthma, uncomplicated: Secondary | ICD-10-CM

## 2018-07-08 ENCOUNTER — Ambulatory Visit: Payer: Self-pay

## 2018-07-17 ENCOUNTER — Ambulatory Visit (INDEPENDENT_AMBULATORY_CARE_PROVIDER_SITE_OTHER): Payer: 59

## 2018-07-17 ENCOUNTER — Ambulatory Visit: Payer: Self-pay

## 2018-07-17 DIAGNOSIS — J4540 Moderate persistent asthma, uncomplicated: Secondary | ICD-10-CM | POA: Diagnosis not present

## 2018-07-29 ENCOUNTER — Other Ambulatory Visit: Payer: Self-pay | Admitting: Allergy

## 2018-07-29 MED ORDER — ALBUTEROL SULFATE HFA 108 (90 BASE) MCG/ACT IN AERS: 2.0000 | INHALATION_SPRAY | RESPIRATORY_TRACT | 1 refills | Status: DC | PRN

## 2018-07-31 ENCOUNTER — Ambulatory Visit: Payer: Self-pay

## 2018-07-31 ENCOUNTER — Ambulatory Visit (INDEPENDENT_AMBULATORY_CARE_PROVIDER_SITE_OTHER): Payer: 59

## 2018-07-31 DIAGNOSIS — J4540 Moderate persistent asthma, uncomplicated: Secondary | ICD-10-CM

## 2018-08-14 ENCOUNTER — Ambulatory Visit: Payer: Self-pay

## 2018-08-19 ENCOUNTER — Ambulatory Visit (INDEPENDENT_AMBULATORY_CARE_PROVIDER_SITE_OTHER): Payer: 59

## 2018-08-19 DIAGNOSIS — J4540 Moderate persistent asthma, uncomplicated: Secondary | ICD-10-CM | POA: Diagnosis not present

## 2018-08-28 ENCOUNTER — Ambulatory Visit: Payer: Self-pay

## 2018-09-01 ENCOUNTER — Ambulatory Visit: Payer: 59 | Admitting: Pediatrics

## 2018-09-03 ENCOUNTER — Ambulatory Visit (INDEPENDENT_AMBULATORY_CARE_PROVIDER_SITE_OTHER): Payer: 59 | Admitting: *Deleted

## 2018-09-03 DIAGNOSIS — J4540 Moderate persistent asthma, uncomplicated: Secondary | ICD-10-CM | POA: Diagnosis not present

## 2018-09-04 ENCOUNTER — Ambulatory Visit: Payer: Self-pay

## 2018-09-21 ENCOUNTER — Ambulatory Visit (INDEPENDENT_AMBULATORY_CARE_PROVIDER_SITE_OTHER): Payer: 59

## 2018-09-21 DIAGNOSIS — J4540 Moderate persistent asthma, uncomplicated: Secondary | ICD-10-CM | POA: Diagnosis not present

## 2018-10-09 ENCOUNTER — Ambulatory Visit (INDEPENDENT_AMBULATORY_CARE_PROVIDER_SITE_OTHER): Payer: 59

## 2018-10-09 DIAGNOSIS — J4540 Moderate persistent asthma, uncomplicated: Secondary | ICD-10-CM | POA: Diagnosis not present

## 2018-10-21 ENCOUNTER — Ambulatory Visit (INDEPENDENT_AMBULATORY_CARE_PROVIDER_SITE_OTHER): Payer: 59 | Admitting: *Deleted

## 2018-10-21 DIAGNOSIS — J4540 Moderate persistent asthma, uncomplicated: Secondary | ICD-10-CM | POA: Diagnosis not present

## 2018-11-05 ENCOUNTER — Telehealth: Payer: Self-pay | Admitting: *Deleted

## 2018-11-05 NOTE — Telephone Encounter (Signed)
I received call from pharmacy regarding reorder of patient's Xolair which he has appt for in [place] tomorrow.  They advised spoke to wife and she advised they wanted to stay on preferred drug ? Dupixent that Dr Tamala Julian had prescribed and just shipped yesterday.  I reached out for clarification on medications patient is taking by leaving message on wife and patient voicemail.  I will have to cancel order for Xolair for now and later D/C Rx if this is the case he is on Dupixent.

## 2018-11-05 NOTE — Telephone Encounter (Signed)
Patient called back and advised he was not aware that the [place] would treat his asthma and atopic dermatitis. He received his delivery today and I advised him to contact his dermatologist to get him started on therapy.  Patient asked if I could cancel his appt for tomorrow and he would call later and make followup appt with Dr Ancil Boozer

## 2018-11-06 ENCOUNTER — Ambulatory Visit: Payer: Self-pay

## 2018-11-09 NOTE — Telephone Encounter (Signed)
Okay.  He should see me in about 2 months to see how he is doing with his asthma, now that he is on [place].  He should continue avoiding the foods that he is allergic to

## 2018-11-09 NOTE — Telephone Encounter (Addendum)
Called patient.  Gave information.  Made 2 month follow up with Dr. Shaune Leeks 01/04/2019 at 4:00pm.

## 2019-01-04 ENCOUNTER — Encounter: Payer: Self-pay | Admitting: Allergy & Immunology

## 2019-01-04 ENCOUNTER — Other Ambulatory Visit: Payer: Self-pay

## 2019-01-04 ENCOUNTER — Ambulatory Visit (INDEPENDENT_AMBULATORY_CARE_PROVIDER_SITE_OTHER): Payer: 59 | Admitting: Allergy & Immunology

## 2019-01-04 VITALS — Ht 67.0 in | Wt 178.0 lb

## 2019-01-04 DIAGNOSIS — J3089 Other allergic rhinitis: Secondary | ICD-10-CM

## 2019-01-04 DIAGNOSIS — K219 Gastro-esophageal reflux disease without esophagitis: Secondary | ICD-10-CM

## 2019-01-04 DIAGNOSIS — T7800XD Anaphylactic reaction due to unspecified food, subsequent encounter: Secondary | ICD-10-CM | POA: Diagnosis not present

## 2019-01-04 DIAGNOSIS — J4551 Severe persistent asthma with (acute) exacerbation: Secondary | ICD-10-CM

## 2019-01-04 MED ORDER — PREDNISONE 10 MG PO TABS: ORAL_TABLET | ORAL | 0 refills | Status: DC

## 2019-01-04 MED ORDER — PANTOPRAZOLE SODIUM 40 MG PO TBEC: 40.0000 mg | DELAYED_RELEASE_TABLET | Freq: Every day | ORAL | 5 refills | Status: DC

## 2019-01-04 NOTE — Patient Instructions (Addendum)
1. Severe persistent asthma with acute exacerbation - We are not going to make any treatment changes today. - Continue with all of the medications at the same dosage.  - We are going to add on Protonix to see if this helps with the wheezing at night.  - We will send in a prednisone pack: Take 3 tabs (30mg ) twice daily for 3 days, then 2 tabs (20mg ) twice daily for 3 days, then 1 tab (10mg ) twice daily for 3 days, then STOP - Daily controller medication(s): Dupixent 300mg  every two weeks, theophylline 300mg  twice daily, Singulair 10mg  daily, Dulera 200/75mcg two puffs twice daily with spacer and Spiriva 1.59mcg two puffs once daily - Prior to physical activity: albuterol 2 puffs 10-15 minutes before physical activity. - Rescue medications: albuterol 4 puffs every 4-6 hours as needed - Asthma control goals:  * Full participation in all desired activities (may need albuterol before activity) * Albuterol use two time or less a week on average (not counting use with activity) * Cough interfering with sleep two time or less a month * Oral steroids no more than once a year * No hospitalizations  2. Gastroesophageal reflux disease - maybe worsening asthma - Start Protonix 40mg  daily.  - Have your wife monitor your nighttime wheezing/coughing to see if the addition of the Protonix is helping at all.   3. Allergic rhinitis - Continue with fluticasone 1-2 sprays daily as needed.  - Continue with Claritin 10mg  daily as needed.  - Consider allergy shots in the future.  - We are going to get an environmental allergy panel since he has been off of Xolair for three months. - This would allow [place] to order allergy shots in the future if needed.   4. Anaphylactic shock due to food - Continue to avoid triggering foods (eggs, seafood) - EpiPen is up to date. - Anaphylaxis management plan provided.  - We are going to get IgE to eggs and seafood since he has been off of his Xolair for three months.  5. Return  in about 2 months (around 03/06/2019). This can be an in-person, a virtual Webex or a telephone follow up visit.   Please inform us of any Emergency Department visits, hospitalizations, or changes in symptoms. Call us before going to the ED for breathing or allergy symptoms since we might be able to fit you in for a sick visit. Feel free to contact us anytime with any questions, problems, or concerns.  It was a pleasure to meet you today!  Websites that have reliable patient information: 1. American Academy of Asthma, Allergy, and Immunology: www.aaaai.org 2. Food Allergy Research and Education (FARE): foodallergy.org 3. Mothers of Asthmatics: http://www.asthmacommunitynetwork.org 4. American College of Allergy, Asthma, and Immunology: www.acaai.org  "Like" [place] on Facebook and Instagram for our latest updates!      Make sure you are registered to vote! If you have moved or changed any of your contact information, you will need to get this updated before voting!    Voter ID laws are NOT going into effect for the General Election in November 2020! DO NOT let this stop you from exercising your right to vote!

## 2019-01-04 NOTE — Progress Notes (Signed)
RE: Alexander Hodge MRN: [id number] DOB: Jan 29, 1961 Date of Telemedicine Visit: 01/04/2019  Referring provider: Kristopher Glee., MD Primary care provider: Kristopher Glee., MD   Dermatologist: Orbie Hurst, MD Hematologist: Dr. Griffin Basil ENT: Dr. Hassell Done  Chief Complaint: Asthma   Telemedicine Follow Up Visit via Telephone: I connected with Alexander Hodge for a follow up on 01/04/19 by telephone and verified that I am speaking with the correct person using two identifiers.   I discussed the limitations, risks, security and privacy concerns of performing an evaluation and management service by telephone and the availability of in person appointments. I also discussed with the patient that there may be a patient responsible charge related to this service. The patient expressed understanding and agreed to proceed.  Patient is at home accompanied by himself who provided/contributed to the history.  Provider is at the office.  Visit start time: 3:55 PM Visit end time: 4:48 PM Insurance consent/check in by: [facility] [employer] [facility] consent and medical assistant/nurse: Sharyn Lull  History of Present Illness:  He is a 58 y.o. male, who is being followed for allergic rhinitis and asthma. His previous allergy office visit was in September 2019 with Dr. Shaune Leeks.  At the last visit, he was doing well.  For his asthma, he was continued on Dulera 200/5 mcg 2 puffs twice daily as well as montelukast 10 mg daily and Ventolin as needed.  He was also continued on Spiriva and theophylline 300 mg twice daily.  He was continued on Xolair 375 mg every 2 weeks.  He was started on a prednisone burst.  For his allergic rhinitis, he was continued on Zyrtec and Flonase.  He was encouraged to continue avoidance of eggs, fish, and shellfish.  In the interim, he was changed to [place] by Dr. Tamala Julian. He feels that he has been on the [place] for a few weeks. The atopic dermatitis is definitely clearing up, but he is still  getting wheezy especially with exposure to grass. He is using his rescue inhaler a bit more recently. He has been using his albuterol nebulizer. He feels fairly stable at this time. He is unsure how long he is going to be on the [place]. He feels that he is using his rescue inhaler less during the day. But he continues to have wheezing at night, which is what his wife reports.   Of note, he is not on a reflux medication at this time. He did have a history of a hiatal hernia fixed several years ago. Since then his symptoms have not been so bad. He does endorse some chest pain when he eats too fast. He was on omeprazole as recently as 2017, but he is not on anything now at this time.   He did have an asthma exacerbation and went to the ED [facility]) sometime in the March 2020. He was given some prednisone there as well. This was the only prednisone that he has had since September 2019. Overall he is very happy with how well his asthma is doing, all things considered.   Asthma/Respiratory Symptom History: He remains on the [facility] 200/5 two puffs twice daily. He does not alwys use a spacer. He is also on the theophylline twice daily as well as Singulair 10mg  daily. He is on the Spiriva as well as the [place]. He needed prednisone in March 2020 as well as September 2019, as mentioned above. He was followed by Dr. Camillo Flaming for a period of time, but it  does not look like he has had an appointment with him in quite some time; last appointment was with PA Elijio Miles in January 2019 but has not seen him since that time.   Allergic Rhinitis Symptom History: He remains on the fluticasone which he uses regularly. He is using Claritin as needed for allergies. He is also on the montelukast tablet at night. He doesn ot get sinus infections often at all. He does report that he gets nose bleeds very often. He did go to see ENT for evaluation of this. He has nose bleeds nearly daily. He sees Dr. Hassell Done. He  last had problems this past Monday when he actually required packing to stop. It is bleeding nearly constantly and is triggered by sneezing and laughing as well as chewing. He is now doing some packing himself at home when he bleeds.  Eczema Symptom Symptom History: He continues to follow with Dr. Tamala Julian (Dermatology). He did undergo patch testing recently. We are going to work on getting these results so that we can all be on the same page.    Otherwise, there have been no changes to his past medical history, surgical history, family history, or social history.  Assessment and Plan:  [name] is a 58 y.o. 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 Severe persistent asthma with acute exacerbation - We are not going to make any treatment changes today. - Continue with all of the medications at the same dosage.  - We are going to add on Protonix to see if this helps with the wheezing at night.  - We will send in a prednisone pack: Take 3 tabs (30mg ) twice daily for 3 days, then 2 tabs (20mg ) twice daily for 3 days, then 1 tab (10mg ) twice daily for 3 days, then STOP - Daily controller medication(s): Dupixent 300mg  every two weeks, theophylline 300mg  twice daily, Singulair 10mg  daily, Dulera 200/31mcg two puffs twice daily with spacer and Spiriva 1.24mcg two puffs once daily - Prior to physical activity: albuterol 2 puffs 10-15 minutes before physical activity. - Rescue medications: albuterol 4 puffs every 4-6 hours as needed - Asthma control goals:  * Full participation in all desired activities (may need albuterol before activity) * Albuterol use two time or less a week on average  (not counting use with activity) * Cough interfering with sleep two time or less a month * Oral steroids no more than once a year * No hospitalizations  2. Gastroesophageal reflux disease - maybe worsening asthma with worsenign wheezing in the middle of the night - Start Protonix 40mg  daily.  - Have your wife monitor your nighttime wheezing/coughing to see if the addition of the Protonix is helping at all.   3. Allergic rhinitis - Continue with fluticasone 1-2 sprays daily as needed.  - Continue with Claritin 10mg  daily as needed.  - Consider allergy shots in the future.  - We are going to get an environmental allergy panel since he has been off of Xolair for three months. - This would allow [place] to order allergy shots in the future if needed.    4. Anaphylactic shock due to food - Continue to avoid triggering foods (eggs, seafood) - EpiPen is up to date. -  Anaphylaxis management plan provided.  - We are going to get IgE to eggs and seafood since he has been off of his Xolair for three months.  5. Atopic dermatitis - with overlying contact dermatitis - We are going to get the records from the patch testing so that we can update our records.  - Agree with continuing [place].  6. Return in about 2 months (around 03/06/2019). This can be an in-person, a virtual Webex or a telephone follow up visit.   Diagnostics: None.  Medication List:  Current Outpatient Medications  Medication Sig Dispense Refill  . albuterol (VENTOLIN HFA) 108 (90 Base) MCG/ACT inhaler Inhale 2 puffs into the lungs every 4 (four) hours as needed for wheezing or shortness of breath. 1 Inhaler 1  . alclomethasone (ACLOVATE) 0.05 % ointment 1(ONE) APPLICATION(S) TOPICAL EVERY DAY    . ammonium lactate (LAC-HYDRIN) 12 % lotion 1(ONE) APPLICATION(S) TOPICAL 2(TWO) TIMES A DAY    . buPROPion (WELLBUTRIN XL) 150 MG 24 hr tablet Take by mouth.    . cetirizine (ZYRTEC) 10 MG tablet Take 10 mg by mouth.    . clobetasol  cream (TEMOVATE) 0.05 % Apply topically.    Marland Kitchen desonide (DESOWEN) 0.05 % ointment Apply topically.    . diphenhydramine-acetaminophen (TYLENOL PM) 25-500 MG TABS tablet Take by mouth.    . Dupilumab (DUPIXENT) 300 MG/2ML SOSY Inject into the skin.    Marland Kitchen EPINEPHrine 0.3 mg/0.3 mL IJ SOAJ injection Use as directed for severe allergic reaction 4 Device 1  . fluticasone (CUTIVATE) 0.05 % cream     . fluticasone (FLONASE) 50 MCG/ACT nasal spray Place into the nose.    . fluticasone furoate-vilanterol (BREO ELLIPTA) 200-25 MCG/INH AEPB Inhale 1 puff into the lungs 2 (two) times daily.    Marland Kitchen FML LIQUIFILM 0.1 % ophthalmic suspension     . folic acid (FOLVITE) 1 MG tablet TAKE 1 TABLET BY MOUTH  DAILY    . hydrocortisone 2.5 % cream Use 2-4 times daily as needed    . levalbuterol (XOPENEX) 1.25 MG/3ML nebulizer solution INHALE 1 VIAL BY NEBULIZATION EVERY 6 (SIX) HOURS AS NEEDED FOR WHEEZING. 72 mL 1  . mometasone-formoterol (DULERA) 200-5 MCG/ACT AERO 2 puffs twice a day to prevent coughing or wheezing. 1 Inhaler 5  . montelukast (SINGULAIR) 10 MG tablet Take 1 tablet at night for coughing or wheezing 90 tablet 3  . omeprazole (PRILOSEC) 40 MG capsule Take 40 mg by mouth daily.    . pimecrolimus (ELIDEL) 1 % cream     . SARNA SENSITIVE 1 % LOTN 1(ONE) APPLICATION(S) TOPICAL EVERY DAY    . tadalafil (CIALIS) 5 MG tablet TAKE 1 TABLET BY MOUTH  DAILY AS NEEDED FOR UP TO  30 DAYS FOR ERECTILE  DYSFUNCTION.    . tamoxifen (NOLVADEX) 20 MG tablet Take 20 mg by mouth.    . tamsulosin (FLOMAX) 0.4 MG CAPS capsule     . theophylline (THEODUR) 300 MG 12 hr tablet Take 1 tablet every 12 hours for coughing or wheezing. 180 tablet 1  . Tiotropium Bromide Monohydrate (SPIRIVA RESPIMAT) 1.25 MCG/ACT AERS Inhale 2 puffs into the lungs daily. To help the breathing. 1 Inhaler 5  . triamcinolone cream (KENALOG) 0.1 % APPLY TO AFFECTED AREA TWICE A DAY AS NEEDED    . alendronate (FOSAMAX) 70 MG tablet Take 70 mg by mouth.     . chlorpheniramine-HYDROcodone (TUSSIONEX) 10-8 MG/5ML SUER TAKE 5 MLS BY MOUTH EVERY 12 (TWELVE) HOURS AS NEEDED FOR  UP TO 7 DAYS.    Marland Kitchen pantoprazole (PROTONIX) 40 MG tablet Take 1 tablet (40 mg total) by mouth daily for 30 days. 30 tablet 5  . predniSONE (DELTASONE) 10 MG tablet Take 3 tabs (30mg ) twice daily for 3 days, then 2 tabs (20mg ) twice daily for 3 days, then 1 tab (10mg ) twice daily for 3 days, then STOP. 36 tablet 0  . traZODone (DESYREL) 50 MG tablet Take by mouth.     Current Facility-Administered Medications  Medication Dose Route Frequency Provider Last Rate Last Dose  . omalizumab Arvid Right) injection 375 mg  375 mg Subcutaneous Q14 Days Charlies Silvers, MD   375 mg at 10/21/18 1555   Allergies: Allergies  Allergen Reactions  . Eggs Or Egg-Derived Products Shortness Of Breath  . Eicosapentaenoic Acid (Epa) Rash and Hives    Unknown reaction Unknown reaction  . Poultry Meal Shortness Of Breath  . Fish Allergy Other (See Comments)    Unknown reaction Unknown reaction Unknown reaction  . Shellfish Allergy   . Fish-Derived Products Rash    Unknown reaction   I reviewed his past medical history, social history, family history, and environmental history and no significant changes have been reported from previous visits.  Review of Systems  Constitutional: Negative for chills, diaphoresis, fatigue and fever.  HENT: Positive for postnasal drip and rhinorrhea. Negative for congestion, ear discharge, ear pain, facial swelling, sinus pressure, sinus pain and sore throat.   Eyes: Negative for pain, discharge and itching.  Respiratory: Positive for cough, shortness of breath and wheezing. Negative for apnea and chest tightness.   Cardiovascular: Negative for chest pain.  Gastrointestinal: Negative for constipation, diarrhea, nausea and vomiting.  Endocrine: Negative for cold intolerance and heat intolerance.  Musculoskeletal: Negative for arthralgias and myalgias.  Skin:  Negative for rash.  Allergic/Immunologic: Negative for environmental allergies, food allergies and immunocompromised state.  Hematological:       Positive for history of anemia.    Objective:  Physical exam not obtained as encounter was done via telephone.   Previous notes and tests were reviewed.  I discussed the assessment and treatment plan with the patient. The patient was provided an opportunity to ask questions and all were answered. The patient agreed with the plan and demonstrated an understanding of the instructions.   The patient was advised to call back or seek an in-person evaluation if the symptoms worsen or if the condition fails to improve as anticipated.  I provided 53 minutes of non-face-to-face time during this encounter.  It was my pleasure to participate in Aidon Klemens care today. Please feel free to contact me with any questions or concerns.   Sincerely,  Valentina Shaggy, MD

## 2019-01-18 ENCOUNTER — Telehealth: Payer: Self-pay | Admitting: *Deleted

## 2019-01-18 NOTE — Telephone Encounter (Signed)
Spoke with Cheick wife- she is going to let Alexander Hodge know his FMLA paperwork is ready for pick up.

## 2019-03-18 ENCOUNTER — Other Ambulatory Visit: Payer: Self-pay

## 2019-03-18 ENCOUNTER — Other Ambulatory Visit: Payer: Self-pay | Admitting: Allergy and Immunology

## 2019-03-18 ENCOUNTER — Ambulatory Visit: Payer: 59 | Admitting: Allergy and Immunology

## 2019-03-18 ENCOUNTER — Encounter: Payer: Self-pay | Admitting: Allergy and Immunology

## 2019-03-18 VITALS — BP 126/76 | HR 82 | Temp 98.2°F | Resp 16 | Ht 66.34 in | Wt 168.0 lb

## 2019-03-18 DIAGNOSIS — T7800XD Anaphylactic reaction due to unspecified food, subsequent encounter: Secondary | ICD-10-CM | POA: Diagnosis not present

## 2019-03-18 DIAGNOSIS — J301 Allergic rhinitis due to pollen: Secondary | ICD-10-CM | POA: Diagnosis not present

## 2019-03-18 DIAGNOSIS — M2550 Pain in unspecified joint: Secondary | ICD-10-CM

## 2019-03-18 DIAGNOSIS — J4551 Severe persistent asthma with (acute) exacerbation: Secondary | ICD-10-CM | POA: Diagnosis not present

## 2019-03-18 DIAGNOSIS — L2089 Other atopic dermatitis: Secondary | ICD-10-CM | POA: Diagnosis not present

## 2019-03-18 DIAGNOSIS — I780 Hereditary hemorrhagic telangiectasia: Secondary | ICD-10-CM

## 2019-03-18 MED ORDER — LEVALBUTEROL HCL 1.25 MG/3ML IN NEBU: INHALATION_SOLUTION | RESPIRATORY_TRACT | 1 refills | Status: DC

## 2019-03-18 MED ORDER — MONTELUKAST SODIUM 10 MG PO TABS: ORAL_TABLET | ORAL | 3 refills | Status: DC

## 2019-03-18 MED ORDER — ALBUTEROL SULFATE HFA 108 (90 BASE) MCG/ACT IN AERS: 2.0000 | INHALATION_SPRAY | RESPIRATORY_TRACT | 2 refills | Status: DC | PRN

## 2019-03-18 MED ORDER — LEVOCETIRIZINE DIHYDROCHLORIDE 5 MG PO TABS: 5.0000 mg | ORAL_TABLET | Freq: Every evening | ORAL | 5 refills | Status: DC

## 2019-03-18 MED ORDER — DULERA 200-5 MCG/ACT IN AERO: INHALATION_SPRAY | RESPIRATORY_TRACT | 5 refills | Status: DC

## 2019-03-18 NOTE — Patient Instructions (Addendum)
Severe persistent asthma  Prednisone has been provided, 40 mg x3 days, 20 mg x1 day, 10 mg x1 day, then stop.  Continue Dulera 200-5 micro grams, 2 inhalations twice daily, Spiriva 1.25 mg 2 inhalations daily, montelukast 10 mg daily, theophylline 300 mg twice daily, and albuterol every 4-6 hours if needed.  To maximize pulmonary deposition, a spacer has been provided along with instructions for its proper administration with an HFA inhaler.  The patient has been asked to contact me if his symptoms persist or progress. Otherwise, he may return for follow up in 2 months.  Allergic rhinitis  Aeroallergen avoidance measures have been discussed and provided in written form.  A prescription has been provided for levocetirizine, 5 mg daily as needed.  Nasal saline spray (i.e., Simply Saline) or nasal saline lavage (i.e., NeilMed) is recommended as needed and prior to medicated nasal sprays.  For thick post nasal drainage, add guaifenesin 1200 mg (Mucinex Maximum Strength)  twice daily as needed with adequate hydration as discussed.  Anaphylactic shock due to adverse food reaction  Continue meticulous avoidance of shellfish, fish, and eggs and have access to epinephrine autoinjector 2 pack in case of accidental ingestion.  Atopic dermatitis  Continue Dupixent injections and clobetasol 0.05% sparingly to affected areas as needed.  Follow-up with dermatologist as recommended.  Arthralgia  Follow-up with primary care physician for further evaluation and consideration of referral to rheumatologist.   Return in about 2 months (around 05/18/2019), or if symptoms worsen or fail to improve.

## 2019-03-18 NOTE — Assessment & Plan Note (Signed)
   Follow-up with primary care physician for further evaluation and consideration of referral to rheumatologist.

## 2019-03-18 NOTE — Assessment & Plan Note (Signed)
   Aeroallergen avoidance measures have been discussed and provided in written form.  A prescription has been provided for levocetirizine, 5 mg daily as needed.  Nasal saline spray (i.e., Simply Saline) or nasal saline lavage (i.e., NeilMed) is recommended as needed and prior to medicated nasal sprays.  For thick post nasal drainage, add guaifenesin 1200 mg (Mucinex Maximum Strength)  twice daily as needed with adequate hydration as discussed.

## 2019-03-18 NOTE — Assessment & Plan Note (Signed)
   Continue meticulous avoidance of shellfish, fish, and eggs and have access to epinephrine autoinjector 2 pack in case of accidental ingestion.

## 2019-03-18 NOTE — Assessment & Plan Note (Addendum)
   Continue Dupixent injections and clobetasol 0.05% sparingly to affected areas as needed.  Follow-up with dermatologist as recommended.

## 2019-03-18 NOTE — Assessment & Plan Note (Signed)
   Prednisone has been provided, 40 mg x3 days, 20 mg x1 day, 10 mg x1 day, then stop.  Continue Dulera 200-5 micro grams, 2 inhalations twice daily, Spiriva 1.25 mg 2 inhalations daily, montelukast 10 mg daily, theophylline 300 mg twice daily, and albuterol every 4-6 hours if needed.  To maximize pulmonary deposition, a spacer has been provided along with instructions for its proper administration with an HFA inhaler.  The patient has been asked to contact me if his symptoms persist or progress. Otherwise, he may return for follow up in 2 months.

## 2019-03-18 NOTE — Progress Notes (Signed)
Follow-up Note  RE: Alexander Hodge MRN: [id number] DOB: 08-20-1961 Date of Office Visit: 03/18/2019  Primary care provider: Kristopher Hodge., MD Referring provider: Kristopher Hodge., MD  History of present illness: Alexander Hodge is a 58 y.o. male with a complex medical history, including severe persistent asthma, allergic rhinitis,  atopic dermatitis and hereditary hemorrhagic telangiectasia presenting today for a sick visit.  He reports that over the past 2 weeks he has experienced nasal congestion, rhinorrhea, nasal pruritus, ear canal pruritus, ear pressure, and thick mucus production.  He denies fevers and chills.  He is unable to take nasal steroid spray because of hereditary hemorrhagic telangiectasia.  He also complains of frequent wheezing which has progressed over the past few days.  He currently takes Dulera 200-5 micro grams, 2 inhalations twice daily, Spiriva Respimat 1.25 g, 2 inhalations daily, 2 inhalations daily, montelukast 10 mg daily, theophylline 300 mg twice daily, and receives Dupixent injections every 2 weeks.  He admits that he does not use a spacer device with HFA inhalers.  He reports that his eczema has improved significantly while on Dupixent though he still requires topical corticosteroid as needed.  He complains that for several months he has experienced stiffness and joint pain in his ankles, knees, hips, shoulders, and wrists.  The stiffness and pain is more prominent after he has been inactive for a period of time.  Assessment and plan: Severe persistent asthma  Prednisone has been provided, 40 mg x3 days, 20 mg x1 day, 10 mg x1 day, then stop.  Continue Dulera 200-5 micro grams, 2 inhalations twice daily, Spiriva 1.25 mg 2 inhalations daily, montelukast 10 mg daily, theophylline 300 mg twice daily, and albuterol every 4-6 hours if needed.  To maximize pulmonary deposition, a spacer has been provided along with instructions for its proper administration with an HFA  inhaler.  The patient has been asked to contact me if his symptoms persist or progress. Otherwise, he may return for follow up in 2 months.  Allergic rhinitis  Aeroallergen avoidance measures have been discussed and provided in written form.  A prescription has been provided for levocetirizine, 5 mg daily as needed.  Nasal saline spray (i.e., Simply Saline) or nasal saline lavage (i.e., NeilMed) is recommended as needed and prior to medicated nasal sprays.  For thick post nasal drainage, add guaifenesin 1200 mg (Mucinex Maximum Strength)  twice daily as needed with adequate hydration as discussed.  Anaphylactic shock due to adverse food reaction  Continue meticulous avoidance of shellfish, fish, and eggs and have access to epinephrine autoinjector 2 pack in case of accidental ingestion.  Atopic dermatitis  Continue Dupixent injections and clobetasol 0.05% sparingly to affected areas as needed.  Follow-up with dermatologist as recommended.  Arthralgia  Follow-up with primary care physician for further evaluation and consideration of referral to rheumatologist.   Meds ordered this encounter  Medications  . albuterol (VENTOLIN HFA) 108 (90 Base) MCG/ACT inhaler    Sig: Inhale 2 puffs into the lungs every 4 (four) hours as needed for wheezing or shortness of breath.    Dispense:  18 g    Refill:  2  . levalbuterol (XOPENEX) 1.25 MG/3ML nebulizer solution    Sig: INHALE 1 VIAL BY NEBULIZATION EVERY 6 (SIX) HOURS AS NEEDED FOR WHEEZING.    Dispense:  72 mL    Refill:  1  . mometasone-formoterol (DULERA) 200-5 MCG/ACT AERO    Sig: 2 puffs twice a day to prevent coughing or  wheezing.    Dispense:  13 g    Refill:  5  . montelukast (SINGULAIR) 10 MG tablet    Sig: Take 1 tablet at night for coughing or wheezing    Dispense:  90 tablet    Refill:  3    Please dispense 90 day supply.  Marland Kitchen levocetirizine (XYZAL) 5 MG tablet    Sig: Take 1 tablet (5 mg total) by mouth every evening.     Dispense:  30 tablet    Refill:  5    Diagnostics: Spirometry reveals an FVC of 2.59 L (74% predicted) and an FEV1 of 1.52 L (56% predicted) with significant (530 mL, 41%) postbronchodilator improvement.  Please see scanned spirometry results for details.    Physical examination: Blood pressure 126/76, pulse 82, temperature 98.2 F (36.8 C), temperature source Oral, resp. rate 16, height 5' 6.34" (1.685 m), weight 167 lb 15.9 oz (76.2 kg), SpO2 99 %.  General: Alert, interactive, in no acute distress. HEENT: TMs pearly gray, turbinates moderately edematous with thick discharge, post-pharynx moderately erythematous. Neck: Supple without lymphadenopathy. Lungs: Decreased breath sounds bilaterally without wheezing, rhonchi or rales. CV: Normal S1, S2 without murmurs. Skin: Dry, hyperpigmented, thickened patches on the antecubital fossae.  The following portions of the patient's history were reviewed and updated as appropriate: allergies, current medications, past family history, past medical history, past social history, past surgical history and problem list.  Allergies as of 03/18/2019      Reactions   Eggs Or Egg-derived Products Shortness Of Breath   Eicosapentaenoic Acid (epa) Rash, Hives   Unknown reaction Unknown reaction   Poultry Meal Shortness Of Breath   Fish Allergy Other (See Comments)   Unknown reaction Unknown reaction Unknown reaction   Shellfish Allergy    Fish-derived Products Rash   Unknown reaction      Medication List       Accurate as of March 18, 2019  6:19 PM. If you have any questions, ask your nurse or doctor.        albuterol 108 (90 Base) MCG/ACT inhaler Commonly known as: Ventolin HFA Inhale 2 puffs into the lungs every 4 (four) hours as needed for wheezing or shortness of breath.   alclomethasone 0.05 % ointment Commonly known as: ACLOVATE 1(ONE) APPLICATION(S) TOPICAL EVERY DAY   alendronate 70 MG tablet Commonly known as: FOSAMAX  Take 70 mg by mouth.   ammonium lactate 12 % lotion Commonly known as: LAC-HYDRIN 1(ONE) APPLICATION(S) TOPICAL 2(TWO) TIMES A DAY   Breo Ellipta 200-25 MCG/INH Aepb Generic drug: fluticasone furoate-vilanterol Inhale 1 puff into the lungs 2 (two) times daily.   buPROPion 150 MG 24 hr tablet Commonly known as: WELLBUTRIN XL Take by mouth.   cetirizine 10 MG tablet Commonly known as: ZYRTEC Take 10 mg by mouth.   chlorpheniramine-HYDROcodone 10-8 MG/5ML Suer Commonly known as: TUSSIONEX TAKE 5 MLS BY MOUTH EVERY 12 (TWELVE) HOURS AS NEEDED FOR UP TO 7 DAYS.   Cialis 5 MG tablet Generic drug: tadalafil TAKE 1 TABLET BY MOUTH  DAILY AS NEEDED FOR UP TO  30 DAYS FOR ERECTILE  DYSFUNCTION.   clobetasol cream 0.05 % Commonly known as: TEMOVATE Apply topically.   desonide 0.05 % ointment Commonly known as: DESOWEN Apply topically.   diphenhydramine-acetaminophen 25-500 MG Tabs tablet Commonly known as: TYLENOL PM Take by mouth.   Dulera 200-5 MCG/ACT Aero Generic drug: mometasone-formoterol 2 puffs twice a day to prevent coughing or wheezing.   Dupixent 300 MG/2ML prefilled  syringe Generic drug: dupilumab Inject into the skin.   EPINEPHrine 0.3 mg/0.3 mL Soaj injection Commonly known as: EPI-PEN Use as directed for severe allergic reaction   fluticasone 0.05 % cream Commonly known as: CUTIVATE   fluticasone 50 MCG/ACT nasal spray Commonly known as: FLONASE Place into the nose.   FML Liquifilm 0.1 % ophthalmic suspension Generic drug: fluorometholone   folic acid 1 MG tablet Commonly known as: FOLVITE TAKE 1 TABLET BY MOUTH  DAILY   hydrocortisone 2.5 % cream Use 2-4 times daily as needed   levalbuterol 1.25 MG/3ML nebulizer solution Commonly known as: XOPENEX INHALE 1 VIAL BY NEBULIZATION EVERY 6 (SIX) HOURS AS NEEDED FOR WHEEZING.   levocetirizine 5 MG tablet Commonly known as: XYZAL Take 1 tablet (5 mg total) by mouth every evening. Started by: Edmonia Lynch, MD   montelukast 10 MG tablet Commonly known as: SINGULAIR Take 1 tablet at night for coughing or wheezing   omeprazole 40 MG capsule Commonly known as: PRILOSEC Take 40 mg by mouth daily.   pantoprazole 40 MG tablet Commonly known as: Protonix Take 1 tablet (40 mg total) by mouth daily for 30 days.   pimecrolimus 1 % cream Commonly known as: ELIDEL   predniSONE 10 MG tablet Commonly known as: DELTASONE Take 3 tabs (30mg ) twice daily for 3 days, then 2 tabs (20mg ) twice daily for 3 days, then 1 tab (10mg ) twice daily for 3 days, then STOP.   Sarna Sensitive 1 % Lotn Generic drug: pramoxine 1(ONE) APPLICATION(S) TOPICAL EVERY DAY   tamoxifen 20 MG tablet Commonly known as: NOLVADEX Take 20 mg by mouth.   tamsulosin 0.4 MG Caps capsule Commonly known as: FLOMAX   theophylline 300 MG 12 hr tablet Commonly known as: THEODUR Take 1 tablet every 12 hours for coughing or wheezing.   Tiotropium Bromide Monohydrate 1.25 MCG/ACT Aers Commonly known as: Spiriva Respimat Inhale 2 puffs into the lungs daily. To help the breathing.   traZODone 50 MG tablet Commonly known as: DESYREL Take by mouth.   triamcinolone cream 0.1 % Commonly known as: KENALOG APPLY TO AFFECTED AREA TWICE A DAY AS NEEDED       Allergies  Allergen Reactions  . Eggs Or Egg-Derived Products Shortness Of Breath  . Eicosapentaenoic Acid (Epa) Rash and Hives    Unknown reaction Unknown reaction  . Poultry Meal Shortness Of Breath  . Fish Allergy Other (See Comments)    Unknown reaction Unknown reaction Unknown reaction  . Shellfish Allergy   . Fish-Derived Products Rash    Unknown reaction   Review of systems: Review of systems negative except as noted in HPI / PMHx or noted below: Constitutional: Negative.  HENT: Negative.   Eyes: Negative.  Respiratory: Negative.   Cardiovascular: Negative.  Gastrointestinal: Negative.  Genitourinary: Negative.  Musculoskeletal:  Negative.  Neurological: Negative.  Endo/Heme/Allergies: Negative.  Cutaneous: Negative.  Past Medical History:  Diagnosis Date  . Asthma     Family History  Problem Relation Age of Onset  . Asthma Mother   . Allergic rhinitis Mother   . Asthma Father   . Allergic rhinitis Father   . Asthma Sister   . Allergic rhinitis Sister   . Asthma Brother   . Allergic rhinitis Brother   . Atopy Paternal Grandfather   . Angioedema Neg Hx   . Eczema Neg Hx   . Immunodeficiency Neg Hx   . Urticaria Neg Hx     Social History   Socioeconomic History  .  Marital status: Married    Spouse name: Not on file  . Number of children: Not on file  . Years of education: Not on file  . Highest education level: Not on file  Occupational History  . Not on file  Social Needs  . Financial resource strain: Not on file  . Food insecurity    Worry: Not on file    Inability: Not on file  . Transportation needs    Medical: Not on file    Non-medical: Not on file  Tobacco Use  . Smoking status: Never Smoker  . Smokeless tobacco: Never Used  Substance and Sexual Activity  . Alcohol use: Yes    Alcohol/week: 0.0 standard drinks    Comment: social drinker   . Drug use: No  . Sexual activity: Not on file  Lifestyle  . Physical activity    Days per week: Not on file    Minutes per session: Not on file  . Stress: Not on file  Relationships  . Social Herbalist on phone: Not on file    Gets together: Not on file    Attends religious service: Not on file    Active member of club or organization: Not on file    Attends meetings of clubs or organizations: Not on file    Relationship status: Not on file  . Intimate partner violence    Fear of current or ex partner: Not on file    Emotionally abused: Not on file    Physically abused: Not on file    Forced sexual activity: Not on file  Other Topics Concern  . Not on file  Social History Narrative  . Not on file    I appreciate  the opportunity to take part in Ural's care. Please do not hesitate to contact me with questions.  Sincerely,   R. Edgar Frisk, MD

## 2019-05-25 ENCOUNTER — Other Ambulatory Visit: Payer: Self-pay

## 2019-05-25 ENCOUNTER — Ambulatory Visit (INDEPENDENT_AMBULATORY_CARE_PROVIDER_SITE_OTHER): Payer: 59 | Admitting: Pediatrics

## 2019-05-25 ENCOUNTER — Encounter: Payer: Self-pay | Admitting: Pediatrics

## 2019-05-25 VITALS — BP 110/70 | HR 80 | Temp 98.1°F | Resp 16

## 2019-05-25 DIAGNOSIS — G4733 Obstructive sleep apnea (adult) (pediatric): Secondary | ICD-10-CM | POA: Diagnosis not present

## 2019-05-25 DIAGNOSIS — J4550 Severe persistent asthma, uncomplicated: Secondary | ICD-10-CM | POA: Diagnosis not present

## 2019-05-25 DIAGNOSIS — L2089 Other atopic dermatitis: Secondary | ICD-10-CM

## 2019-05-25 DIAGNOSIS — D681 Hereditary factor XI deficiency: Secondary | ICD-10-CM | POA: Diagnosis not present

## 2019-05-25 DIAGNOSIS — H1010 Acute atopic conjunctivitis, unspecified eye: Secondary | ICD-10-CM

## 2019-05-25 DIAGNOSIS — T7800XD Anaphylactic reaction due to unspecified food, subsequent encounter: Secondary | ICD-10-CM

## 2019-05-25 DIAGNOSIS — I780 Hereditary hemorrhagic telangiectasia: Secondary | ICD-10-CM

## 2019-05-25 MED ORDER — ALBUTEROL SULFATE HFA 108 (90 BASE) MCG/ACT IN AERS: 2.0000 | INHALATION_SPRAY | RESPIRATORY_TRACT | 1 refills | Status: DC | PRN

## 2019-05-25 MED ORDER — SPIRIVA RESPIMAT 1.25 MCG/ACT IN AERS: 2.0000 | INHALATION_SPRAY | Freq: Every day | RESPIRATORY_TRACT | 3 refills | Status: DC

## 2019-05-25 MED ORDER — DULERA 200-5 MCG/ACT IN AERO: 2.0000 | INHALATION_SPRAY | Freq: Two times a day (BID) | RESPIRATORY_TRACT | 3 refills | Status: DC

## 2019-05-25 MED ORDER — TOBRAMYCIN-DEXAMETHASONE 0.3-0.1 % OP SUSP: OPHTHALMIC | 0 refills | Status: DC

## 2019-05-25 MED ORDER — THEOPHYLLINE ER 300 MG PO TB12: ORAL_TABLET | ORAL | 3 refills | Status: DC

## 2019-05-25 MED ORDER — MONTELUKAST SODIUM 10 MG PO TABS: ORAL_TABLET | ORAL | 3 refills | Status: DC

## 2019-05-25 NOTE — Patient Instructions (Addendum)
Zyrtec 10 mg-take 1 tablet once a day for runny nose or itchy eyes Saline nasal gel as needed for dryness in the nose If you have soreness in your nose, use Polysporin ointment twice a day in each nostril for 1 week TobraDex 0.3%-use 1 drop 3 times a day for a week in each eye  Dulera 200-5-2 puffs every 12 hours to prevent coughing or wheezing using a spacer Spiriva 1.25 mcg- 2 puffs once a day to help with your breathing Montelukast 10 mg-take 1 tablet once a day to prevent coughing or wheezing Theodur  300 mg -1 tablet every 12 hours for coughing or wheezing Ventolin 2 puffs every 4 hours if needed for wheezing or coughing spells or instead Xopenex 1.25 mg - 1 unit dose every 6 hours if needed Continue Dupixent every 2 weeks for your eczema and asthma  Call us if you are not doing well on this treatment plan Continue on your other medications Continue on the treatment of your eczema prescribed by your dermatologist. Get   flu shot this fall  Discuss your joint pains with your orthopedic specialist when you see him this week  Continue avoiding shellfish, fish and eggs.  If you have an allergic reaction take Benadryl 50 mg every 4 hours and if you have life-threatening symptoms inject with EpiPen 0.3 mg

## 2019-05-25 NOTE — Progress Notes (Signed)
[address] [place] [address] Dept: [phone]  FOLLOW UP NOTE  Patient ID: Alexander Hodge, male    DOB: 04-17-1961  Age: 58 y.o. MRN: [id number] Date of Office Visit: 05/25/2019  Assessment  Chief Complaint: Asthma (is doing well)  HPI Alexander Hodge presents for follow-up of asthma, food allergies, eczema and allergic rhinitis.  Since she was started on Dupixent every 2 weeks, his eczema and asthma have been much improved.  He is not on Xolair.  He is on Dulera 200-5-2 puffs every 12 hours, Spiriva 1.25 MCG's- 2 puffs once a day, montelukast 10 mg once a day, Theo-Dur 300 mg every 12 hours.  He very rarely has to use Ventolin.  He has been complaining of red itchy eyes for about 2 weeks with a discolored mucus in his eyes.  He is on Zyrtec 10 mg once a day.  Because of nosebleeds he cannot use fluticasone.  He continues to avoid fish, shellfish and eggs.  He has osteoporosis and is on Fosamax once a week.  He is seeing an orthopedic specialist and he will discuss his joint pains that he has had for several months.  His hemoglobin is 13 and he is followed by hematologist at [facility] for his factor XI deficiency and hereditary hemorrhagic telangiectasia.  His mid hemoglobin has been very low in the past and he has needed blood transfusions   Drug Allergies:  Allergies  Allergen Reactions  . Eggs Or Egg-Derived Products Shortness Of Breath  . Eicosapentaenoic Acid (Epa) Rash and Hives    Unknown reaction Unknown reaction  . Poultry Meal Shortness Of Breath  . Fish Allergy Other (See Comments)    Unknown reaction Unknown reaction Unknown reaction  . Shellfish Allergy   . Fish-Derived Products Rash    Unknown reaction    Physical Exam: BP 110/70 (BP Location: Left Arm, Patient Position: Sitting, Cuff Size: Normal)   Pulse 80   Temp 98.1 F (36.7 C) (Oral)   Resp 16   SpO2 97%    Physical Exam Vitals signs reviewed.  Constitutional:      Appearance: Normal  appearance. He is normal weight.  HENT:     Head:     Comments: Eyes showed some erythema of the conjunctiva with a  light yellow mucus.  Nose showed some old clots.  Ears normal.  Pharynx normal. Neck:     Musculoskeletal: Neck supple.  Cardiovascular:     Comments: S1-S2 normal no murmurs Pulmonary:     Comments: Clear to percussion auscultation except for mild wheezing on end expiration Lymphadenopathy:     Cervical: No cervical adenopathy.  Neurological:     General: No focal deficit present.     Mental Status: He is alert and oriented to person, place, and time.  Psychiatric:        Mood and Affect: Mood normal.        Behavior: Behavior normal.        Thought Content: Thought content normal.        Judgment: Judgment normal.     Diagnostics: FVC 2.46 L FEV1 1.51 L.  Predicted FVC 3.49 L predicted FEV1 2.73 L-this shows a mild reduction in the forced vital capacity  and a moderate reduction in the FEV1  Assessment and Plan: 1. Severe persistent asthma without complication   2. Anaphylactic shock due to food, subsequent encounter   3. 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 Hereditary hemorrhagic telangiectasia ([place])   6. Seasonal allergic conjunctivitis   7. Flexural atopic dermatitis     Meds ordered this encounter  Medications  . albuterol (VENTOLIN HFA) 108 (90 Base) MCG/ACT inhaler    Sig: Inhale 2 puffs into the lungs every 4 (four) hours as needed for wheezing or shortness of breath.    Dispense:  18 g    Refill:  1  . montelukast (SINGULAIR) 10 MG tablet    Sig: Take 1 tablet at night for coughing or wheezing    Dispense:  90 tablet    Refill:  3    Please dispense 90 day supply. Please keep rx on file.  . Tiotropium Bromide Monohydrate (SPIRIVA RESPIMAT) 1.25 MCG/ACT AERS    Sig: Inhale 2 puffs into the lungs daily.    Dispense:  12 g    Refill:  3    Please keep rx on file. Pt. Will call when needed.  . mometasone-formoterol  (DULERA) 200-5 MCG/ACT AERO    Sig: Inhale 2 puffs into the lungs 2 (two) times daily.    Dispense:  39 g    Refill:  3    Please dispense 90 day supply. Please keep rx on file. Pt. Will call when needed.  . theophylline (THEODUR) 300 MG 12 hr tablet    Sig: Take 1 tablet twice daily for coughing or wheezing.    Dispense:  180 tablet    Refill:  3  . tobramycin-dexamethasone (TOBRADEX) ophthalmic solution    Sig: Use 1 drop 3 times a day for a week in each eye    Dispense:  5 mL    Refill:  0    Patient Instructions  Zyrtec 10 mg-take 1 tablet once a day for runny nose or itchy eyes Saline nasal gel as needed for dryness in the nose If you have soreness in your nose, use Polysporin ointment twice a day in each nostril for 1 week TobraDex 0.3%-use 1 drop 3 times a day for a week in each eye  Dulera 200-5-2 puffs every 12 hours to prevent coughing or wheezing using a spacer Spiriva 1.25 mcg- 2 puffs once a day to help with your breathing Montelukast 10 mg-take 1 tablet once a day to prevent coughing or wheezing Theodur  300 mg -1 tablet every 12 hours for coughing or wheezing Ventolin 2 puffs every 4 hours if needed for wheezing or coughing spells or instead Xopenex 1.25 mg - 1 unit dose every 6 hours if needed Continue Dupixent every 2 weeks for your eczema and asthma  Call us if you are not doing well on this treatment plan Continue on your other medications Continue on the treatment of your eczema prescribed by your dermatologist. Get   flu shot this fall  Discuss your joint pains with your orthopedic specialist when you see him this week  Continue avoiding shellfish, fish and eggs.  If you have an allergic reaction take Benadryl 50 mg every 4 hours and if you have life-threatening symptoms inject with EpiPen 0.3 mg    Return in about 6 months (around 11/22/2019).    Thank you for the opportunity to care for this patient.  Please do not hesitate to contact me with questions.   Penne Lash, M.D.  [facility] [address] [place], [place] [address] [phone]

## 2019-06-07 ENCOUNTER — Other Ambulatory Visit: Payer: Self-pay | Admitting: *Deleted

## 2019-06-07 MED ORDER — THEOPHYLLINE ER 300 MG PO TB12: ORAL_TABLET | ORAL | 1 refills | Status: DC

## 2019-08-10 ENCOUNTER — Other Ambulatory Visit: Payer: Self-pay

## 2019-08-10 MED ORDER — EPINEPHRINE 0.3 MG/0.3ML IJ SOAJ: INTRAMUSCULAR | 1 refills | Status: DC

## 2019-09-07 ENCOUNTER — Other Ambulatory Visit: Payer: Self-pay

## 2019-09-07 MED ORDER — EPINEPHRINE 0.3 MG/0.3ML IJ SOAJ: INTRAMUSCULAR | 1 refills | Status: DC

## 2019-09-07 NOTE — Telephone Encounter (Signed)
Sent in a refill for epi pens 0.3 mg to [facility] [address].

## 2020-01-11 ENCOUNTER — Ambulatory Visit: Payer: 59 | Admitting: Pediatrics

## 2020-01-11 ENCOUNTER — Other Ambulatory Visit: Payer: Self-pay

## 2020-01-11 ENCOUNTER — Encounter: Payer: Self-pay | Admitting: Pediatrics

## 2020-01-11 VITALS — BP 120/72 | HR 72 | Temp 98.5°F | Resp 16 | Ht 65.0 in | Wt 158.3 lb

## 2020-01-11 DIAGNOSIS — J4550 Severe persistent asthma, uncomplicated: Secondary | ICD-10-CM

## 2020-01-11 DIAGNOSIS — T7800XD Anaphylactic reaction due to unspecified food, subsequent encounter: Secondary | ICD-10-CM | POA: Diagnosis not present

## 2020-01-11 DIAGNOSIS — I780 Hereditary hemorrhagic telangiectasia: Secondary | ICD-10-CM

## 2020-01-11 DIAGNOSIS — G4733 Obstructive sleep apnea (adult) (pediatric): Secondary | ICD-10-CM

## 2020-01-11 DIAGNOSIS — J3089 Other allergic rhinitis: Secondary | ICD-10-CM

## 2020-01-11 DIAGNOSIS — D681 Hereditary factor XI deficiency: Secondary | ICD-10-CM

## 2020-01-11 DIAGNOSIS — L2089 Other atopic dermatitis: Secondary | ICD-10-CM

## 2020-01-11 MED ORDER — LEVALBUTEROL HCL 1.25 MG/3ML IN NEBU: INHALATION_SOLUTION | RESPIRATORY_TRACT | 1 refills | Status: DC

## 2020-01-11 MED ORDER — ALBUTEROL SULFATE HFA 108 (90 BASE) MCG/ACT IN AERS: 2.0000 | INHALATION_SPRAY | RESPIRATORY_TRACT | 1 refills | Status: DC | PRN

## 2020-01-11 MED ORDER — SPIRIVA RESPIMAT 1.25 MCG/ACT IN AERS: 2.0000 | INHALATION_SPRAY | Freq: Every day | RESPIRATORY_TRACT | 1 refills | Status: DC

## 2020-01-11 MED ORDER — THEOPHYLLINE ER 300 MG PO TB12: ORAL_TABLET | ORAL | 5 refills | Status: DC

## 2020-01-11 MED ORDER — DULERA 200-5 MCG/ACT IN AERO: INHALATION_SPRAY | RESPIRATORY_TRACT | 1 refills | Status: DC

## 2020-01-11 MED ORDER — MONTELUKAST SODIUM 10 MG PO TABS: ORAL_TABLET | ORAL | 1 refills | Status: DC

## 2020-01-11 MED ORDER — EPINEPHRINE 0.3 MG/0.3ML IJ SOAJ: INTRAMUSCULAR | 1 refills | Status: DC

## 2020-01-11 NOTE — Progress Notes (Addendum)
[address] [place] [address] Dept: [phone]  FOLLOW UP NOTE  Patient ID: Alexander Hodge, male    DOB: 01-11-1961  Age: 59 y.o. MRN: [id number] Date of Office Visit: 01/11/2020  Assessment  Chief Complaint: Asthma (doing good. he wanted to see you before you retired and to review his medicines with you.)  HPI POLICARPIO CHAND presents for follow-up of asthma and allergic rhinitis.  His  asthma has been well controlled by the use of Dulera 200-2 puffs every 12 hours, montelukast 10 mg once a day and Theodur  300 mg every 12 hours.  He rarely has to use Ventolin.  He ran out of  Spiriva 1.25 MCG's 2 puffs once a day.  He has been losing weight and is being evaluated at [facility] he has responded well to dupilumab every 2 weeks with regard to his asthma and eczema  He has had a painful enlargement of the right breast which was treated with prednisone and an antibiotic.  He is better but the right breast is still swollen   Drug Allergies:  Allergies  Allergen Reactions  . Eggs Or Egg-Derived Products Shortness Of Breath  . Eicosapentaenoic Acid (Epa) Rash and Hives    Unknown reaction Unknown reaction  . Poultry Meal Shortness Of Breath  . Fish Allergy Other (See Comments)    Unknown reaction Unknown reaction Unknown reaction  . Shellfish Allergy   . Fish-Derived Products Rash    Unknown reaction    Physical Exam: BP 120/72 (BP Location: Left Arm, Patient Position: Sitting, Cuff Size: Normal)   Pulse 72   Temp 98.5 F (36.9 C) (Oral)   Resp 16   Ht 5\' 5"  (1.651 m)   Wt 158 lb 4.6 oz (71.8 kg)   SpO2 99%   BMI 26.34 kg/m    Physical Exam Vitals reviewed.  Constitutional:      Appearance: Normal appearance. He is normal weight.  HENT:     Head:     Comments: Eyes normal. Ears normal. Nose normal. Pharynx normal. Cardiovascular:     Rate and Rhythm: Normal rate and regular rhythm.     Comments: S1-S2 normal no murmurs Pulmonary:     Comments: Clear to  percussion and auscultation Musculoskeletal:     Cervical back: Neck supple.     Comments: There was some enlargement in the right breast compared to the left.  It felt firm  Lymphadenopathy:     Cervical: No cervical adenopathy.  Neurological:     General: No focal deficit present.     Mental Status: He is alert and oriented to person, place, and time. Mental status is at baseline.  Psychiatric:        Mood and Affect: Mood normal.        Behavior: Behavior normal.        Thought Content: Thought content normal.        Judgment: Judgment normal.     Diagnostics: FVC 2.46 L FEV1 1.45 L. Predicted FVC 3.32 L predicted FEV1 2.59 L-just shows a mild reduction in the forced vital capacity and moderate reduction in FEV1   Assessment and Plan: 1. Severe persistent asthma without complication   2. Anaphylactic shock due to food, subsequent encounter   3. Obstructive sleep apnea   4. 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 Flexural atopic dermatitis   7. Other allergic rhinitis     Meds ordered this encounter  Medications  . levalbuterol (XOPENEX) 1.25 MG/3ML nebulizer solution    Sig: INHALE 1 VIAL BY NEBULIZATION EVERY 6 (SIX) HOURS AS NEEDED FOR WHEEZING.    Dispense:  72 mL    Refill:  1  . albuterol (VENTOLIN HFA) 108 (90 Base) MCG/ACT inhaler    Sig: Inhale 2 puffs into the lungs every 4 (four) hours as needed for wheezing or shortness of breath.    Dispense:  18 g    Refill:  1  . mometasone-formoterol (DULERA) 200-5 MCG/ACT AERO    Sig: 2 puffs every 12 hours to prevent coughing or wheezing.    Dispense:  39 g    Refill:  1    Please dispense 90 day supply.  . theophylline (THEODUR) 300 MG 12 hr tablet    Sig: Take 1 tablet twice daily for coughing or wheezing.    Dispense:  180 tablet    Refill:  5    Please dispense 90 day supply.  Marland Kitchen EPINEPHrine 0.3 mg/0.3 mL IJ SOAJ injection    Sig: Use as directed for severe allergic  reaction    Dispense:  2 each    Refill:  1    Dispense mylan generic or brand name whichever is cheaper for the pt. Do not fill the adrenaclick. Please keep rx on hold. Pt. Will call when needed.  . Tiotropium Bromide Monohydrate (SPIRIVA RESPIMAT) 1.25 MCG/ACT AERS    Sig: Inhale 2 puffs into the lungs daily.    Dispense:  12 g    Refill:  1    Please dispense 90 day supply.  . montelukast (SINGULAIR) 10 MG tablet    Sig: Take 1 tablet once a day for coughing or wheezing.    Dispense:  90 tablet    Refill:  1    Please dispense 90 day supply.    Patient Instructions  Zyrtec 10 mg-take 1 tablet once a day for runny nose or itchy eyes Nasal saline gel as needed for dryness in the nose  Dulera 200-2 puffs every 12 hours to prevent coughing or wheezing using a spacer Spiriva 1.25 MCG's-2 puffs once a day to help with your breathing Montelukast 10 mg-take 1 tablet once a day to prevent coughing or wheezing Theodur  300 mg-1 tablet every 12 hours for coughing or wheezing but if you are doing well next week decrease to half a tablet every 12 hours Ventolin 2 puffs every 4 hours if needed for wheezing or coughing spells or instead Xopenex 1.25 mg - 1 unit dose every 6 hours if needed Continue Dupixent every 2 weeks for your eczema and asthma  See your family doctor if you continue to have an enlarged right breast.  You may need a mammogram.  Let the people at [facility] know.  Continue avoiding shellfish, fish and eggs.  If you have an allergic reaction take Benadryl 50 mg every 4 hours and if you have life-threatening symptoms inject with EpiPen 0.3 mg  Continue on your other medications  Call us if you are not doing well on this treatment plan   Return in about 3 months (around 04/11/2020).    Thank you for the opportunity to care for this patient.  Please do not hesitate to contact me with questions.  Penne Lash, M.D.  [facility] [address] [place], [place] [address] [phone]

## 2020-01-11 NOTE — Patient Instructions (Addendum)
Zyrtec 10 mg-take 1 tablet once a day for runny nose or itchy eyes Nasal saline gel as needed for dryness in the nose  Dulera 200-2 puffs every 12 hours to prevent coughing or wheezing using a spacer Spiriva 1.25 MCG's-2 puffs once a day to help with your breathing Montelukast 10 mg-take 1 tablet once a day to prevent coughing or wheezing Theodur  300 mg-1 tablet every 12 hours for coughing or wheezing but if you are doing well next week decrease to half a tablet every 12 hours Ventolin 2 puffs every 4 hours if needed for wheezing or coughing spells or instead Xopenex 1.25 mg - 1 unit dose every 6 hours if needed Continue Dupixent every 2 weeks for your eczema and asthma  See your family doctor if you continue to have an enlarged right breast.  You may need a mammogram.  Let the people at [facility] know.  Continue avoiding shellfish, fish and eggs.  If you have an allergic reaction take Benadryl 50 mg every 4 hours and if you have life-threatening symptoms inject with EpiPen 0.3 mg  Continue on your other medications  Call us if you are not doing well on this treatment plan

## 2020-05-05 ENCOUNTER — Telehealth: Payer: Self-pay | Admitting: Allergy and Immunology

## 2020-05-15 ENCOUNTER — Encounter: Payer: Self-pay | Admitting: Allergy and Immunology

## 2020-05-15 ENCOUNTER — Ambulatory Visit (INDEPENDENT_AMBULATORY_CARE_PROVIDER_SITE_OTHER): Payer: 59 | Admitting: Allergy and Immunology

## 2020-05-15 ENCOUNTER — Other Ambulatory Visit: Payer: Self-pay

## 2020-05-15 VITALS — BP 110/90 | HR 75 | Temp 98.2°F | Resp 18 | Ht 67.0 in | Wt 165.8 lb

## 2020-05-15 DIAGNOSIS — J3089 Other allergic rhinitis: Secondary | ICD-10-CM | POA: Diagnosis not present

## 2020-05-15 DIAGNOSIS — R040 Epistaxis: Secondary | ICD-10-CM

## 2020-05-15 DIAGNOSIS — L2089 Other atopic dermatitis: Secondary | ICD-10-CM

## 2020-05-15 DIAGNOSIS — H1013 Acute atopic conjunctivitis, bilateral: Secondary | ICD-10-CM | POA: Diagnosis not present

## 2020-05-15 DIAGNOSIS — J4551 Severe persistent asthma with (acute) exacerbation: Secondary | ICD-10-CM | POA: Diagnosis not present

## 2020-05-15 DIAGNOSIS — H1010 Acute atopic conjunctivitis, unspecified eye: Secondary | ICD-10-CM | POA: Insufficient documentation

## 2020-05-15 MED ORDER — SPIRIVA RESPIMAT 1.25 MCG/ACT IN AERS: 2.0000 | INHALATION_SPRAY | Freq: Every day | RESPIRATORY_TRACT | 5 refills | Status: DC

## 2020-05-15 NOTE — Assessment & Plan Note (Signed)
   Continue appropriate skin care measures.  Continue Dupixent injections as prescribed.   Continue clobetasol 0.05% sparingly to affected areas as needed.  Follow-up with dermatologist as recommended.

## 2020-05-15 NOTE — Progress Notes (Signed)
Follow-up Note  RE: [name] MRN: [id number] DOB: 06-25-1961 Date of Office Visit: 05/15/2020  Primary care provider: Kristopher Glee., MD Referring provider: Kristopher Glee., MD  History of present illness: Alexander Hodge is a 59 y.o. male with persistent asthma and allergic rhinitis presenting today for follow-up.  He was last seen in this clinic in January 11, 2020 by Dr. Shaune Leeks.  He reports that over the past 2 weeks he has been experiencing "more wheezing than in the past few months."  The wheezing has been more frequent/severe at nighttime.  He has been requiring albuterol, with benefit, more frequently.  He admits that he has not been using Spiriva because he ran out.  In addition, he has not been using a spacer device with Dulera.  In addition to the lower respiratory symptoms, over the past 2 weeks he has been experiencing nasal congestion, thick postnasal drainage, ear canal pruritus, and matting of the eyelids in the morning.  He has been followed by an otolaryngologist for epistaxis.  He states that his nose bleeds "a little bit every day."  [name] reports that his eczema has improved significantly while taking Dupixent injections, however he still has some dark areas on his shins.  He is followed by dermatologist for this problem.  Assessment and plan: Severe persistent asthma  Prednisone has been provided, 40 mg x3 days, 20 mg x1 day, 10 mg x1 day, then stop.  Continue Dulera 200-5 g, 2 inhalations twice daily.  To maximize pulmonary deposition, a spacer has been provided along with instructions for its proper administration with an HFA inhaler.  Restart Spiriva 1.25 mg 2 inhalations daily.  A refill prescription has been provided.  Continue montelukast 10 mg daily.  Continue theophylline 300 mg twice daily.  A theophylline level will be checked.  Continue albuterol every 4-6 hours if needed.  The patient has been asked to contact me if his symptoms persist or  progress. Otherwise, he may return for follow up in 2 months.  Other allergic rhinitis  Continue appropriate aeroallergen avoidance measures.  Levocetirizine, 5 mg daily as needed.  To avoid diminishing benefit with daily use (tachyphylaxis) of second generation antihistamine, consider alternating every few months between fexofenadine (Allegra) and levocetirizine (Xyzal).  Azelastine nasal spray, 1 to 2 sprays per nostril twice daily as needed.  Nasal saline spray (i.e., Simply Saline) or nasal saline lavage (i.e., NeilMed) is recommended as needed and prior to medicated nasal sprays.  For thick post nasal drainage, add guaifenesin 1200 mg (Mucinex Maximum Strength)  twice daily as needed with adequate hydration as discussed.  Allergic conjunctivitis  Treatment plan as outlined above for allergic rhinitis.  A prescription has been provided for generic Pataday, one drop per eye daily as needed.  If insurance does not cover this medication, medicated allergy eyedrops may be purchased over-the-counter as Restaurant manager, fast food.  I have also recommended eye lubricant drops (i.e., Natural Tears) as needed.  Epistaxis  Proper technique for stanching epistaxis has been discussed and demonstrated.  Nasal saline spray and/or nasal saline gel is recommended to moisturize nasal mucosa.  The use of a cool-mist humidifier during the night is recommended.  During epistaxis, if needed, oxymetazoline (Afrin) nasal spray may be applied to a cotton ball to help stanch the blood flow.  Follow-up with otolaryngologist.   Atopic dermatitis  Continue appropriate skin care measures.  Continue Dupixent injections as prescribed.   Continue clobetasol 0.05% sparingly to  affected areas as needed.  Follow-up with dermatologist as recommended.   Meds ordered this encounter  Medications  . Tiotropium Bromide Monohydrate (SPIRIVA RESPIMAT) 1.25 MCG/ACT AERS    Sig: Inhale 2 puffs into  the lungs daily.    Dispense:  4 g    Refill:  5    Diagnostics: Spirometry reveals an FVC of 2.67 L (75% predicted) and an FEV1 of 1.41 L (51% predicted) with significant (47%) postbronchodilator improvement.  Please see scanned spirometry results for details.    Physical examination: Blood pressure 110/90, pulse 75, temperature 98.2 F (36.8 C), temperature source Oral, resp. rate 18, height 5\' 7"  (1.702 m), weight 165 lb 12.8 oz (75.2 kg), SpO2 95 %.  General: Alert, interactive, in no acute distress. HEENT: TMs pearly gray, turbinates moderately edematous without discharge, post-pharynx moderately erythematous. Neck: Supple without lymphadenopathy. Lungs: Mildly decreased breath sounds bilaterally without wheezing, rhonchi or rales. CV: Normal S1, S2 without murmurs. Skin: Dry, mildly hyperpigmented, mildly thickened patches on the shins bilaterally.  The following portions of the patient's history were reviewed and updated as appropriate: allergies, current medications, past family history, past medical history, past social history, past surgical history and problem list.  Current Outpatient Medications  Medication Sig Dispense Refill  . albuterol (VENTOLIN HFA) 108 (90 Base) MCG/ACT inhaler Inhale 2 puffs into the lungs every 4 (four) hours as needed for wheezing or shortness of breath. 18 g 1  . alclomethasone (ACLOVATE) 0.05 % ointment 1(ONE) APPLICATION(S) TOPICAL EVERY DAY    . alendronate (FOSAMAX) 70 MG tablet Take 70 mg by mouth.    Marland Kitchen aminocaproic acid (AMICAR) 25 % solution     . ammonium lactate (LAC-HYDRIN) 12 % lotion 1(ONE) APPLICATION(S) TOPICAL 2(TWO) TIMES A DAY    . atorvastatin (LIPITOR) 20 MG tablet Take by mouth.    Marland Kitchen buPROPion (WELLBUTRIN XL) 150 MG 24 hr tablet Take by mouth.    . cetirizine (ZYRTEC) 10 MG tablet Take 10 mg by mouth.    . clobetasol cream (TEMOVATE) 0.05 % Apply topically.    Marland Kitchen desonide (DESOWEN) 0.05 % ointment Apply topically.    .  diclofenac Sodium (VOLTAREN) 1 % GEL Apply small amount sparingly to the right posterior heel and achilles tendon    . diphenhydramine-acetaminophen (TYLENOL PM) 25-500 MG TABS tablet Take by mouth.    . DULoxetine (CYMBALTA) 60 MG capsule TAKE 1 CAPSULE BY MOUTH ONCE DAILY WITH FOOD    . Dupilumab (DUPIXENT) 300 MG/2ML SOSY Inject into the skin.    Marland Kitchen EPINEPHrine 0.3 mg/0.3 mL IJ SOAJ injection Use as directed for severe allergic reaction 2 each 1  . ergocalciferol (VITAMIN D2) 1.25 MG (50000 UT) capsule TAKE 1 CAPSULE BY MOUTH ONE TIME PER WEEK    . fluorometholone (FML) 0.1 % ophthalmic suspension Place 1 drop into both eyes 2 times daily.    . fluticasone (CUTIVATE) 0.05 % cream     . fluticasone (FLONASE) 50 MCG/ACT nasal spray Place into the nose.    . hydrocortisone 2.5 % cream Use 2-4 times daily as needed    . levalbuterol (XOPENEX) 1.25 MG/3ML nebulizer solution INHALE 1 VIAL BY NEBULIZATION EVERY 6 (SIX) HOURS AS NEEDED FOR WHEEZING. 72 mL 1  . mometasone-formoterol (DULERA) 200-5 MCG/ACT AERO 2 puffs every 12 hours to prevent coughing or wheezing. 39 g 1  . montelukast (SINGULAIR) 10 MG tablet Take 1 tablet once a day for coughing or wheezing. 90 tablet 1  . pimecrolimus (ELIDEL) 1 %  cream     . saline (AYR) GEL APPLY TO NOSE ONCE OR TWICE A DAY FOR NASAL IRRITATION.    Marland Kitchen SARNA SENSITIVE 1 % LOTN 1(ONE) APPLICATION(S) TOPICAL EVERY DAY    . tacrolimus (PROTOPIC) 0.1 % ointment Apply 1 application topically 2 (two) times daily as needed.    . tadalafil (CIALIS) 5 MG tablet TAKE 1 TABLET BY MOUTH  DAILY AS NEEDED FOR UP TO  30 DAYS FOR ERECTILE  DYSFUNCTION.    . tamoxifen (NOLVADEX) 20 MG tablet Take 20 mg by mouth.    . tamsulosin (FLOMAX) 0.4 MG CAPS capsule     . theophylline (THEODUR) 300 MG 12 hr tablet Take 1 tablet twice daily for coughing or wheezing. 180 tablet 5  . Tiotropium Bromide Monohydrate (SPIRIVA RESPIMAT) 1.25 MCG/ACT AERS Inhale 2 puffs into the lungs daily. 12 g 1    . tobramycin-dexamethasone (TOBRADEX) ophthalmic solution Use 1 drop 3 times a day for a week in each eye 5 mL 0  . triamcinolone cream (KENALOG) 0.1 % APPLY TO AFFECTED AREA TWICE A DAY AS NEEDED    . triamcinolone ointment (KENALOG) 0.1 % 1(ONE) APPLICATION(S) TOPICAL 2(TWO) TIMES A DAY TO LEGS FOR A WEEK THEN STOP FOR A WEEK. REPEAT    . folic acid (FOLVITE) 1 MG tablet TAKE 1 TABLET BY MOUTH  DAILY (Patient not taking: Reported on 05/15/2020)    . pantoprazole (PROTONIX) 40 MG tablet Take 1 tablet (40 mg total) by mouth daily for 30 days. 30 tablet 5  . pregabalin (LYRICA) 75 MG capsule Take by mouth.    . Tiotropium Bromide Monohydrate (SPIRIVA RESPIMAT) 1.25 MCG/ACT AERS Inhale 2 puffs into the lungs daily. 4 g 5  . traZODone (DESYREL) 50 MG tablet Take by mouth.     Current Facility-Administered Medications  Medication Dose Route Frequency Provider Last Rate Last Admin  . omalizumab Arvid Right) injection 375 mg  375 mg Subcutaneous Q14 Days Prince Solian A, MD   375 mg at 10/21/18 1555    Allergies  Allergen Reactions  . Eggs Or Egg-Derived Products Shortness Of Breath  . Eicosapentaenoic Acid (Epa) Rash and Hives    Unknown reaction Unknown reaction  . Poultry Meal Shortness Of Breath  . Fish Allergy Other (See Comments)    Unknown reaction Unknown reaction Unknown reaction  . Shellfish Allergy   . Fish-Derived Products Rash    Unknown reaction     Review of systems: Review of systems negative except as noted in HPI / PMHx.  Past Medical History:  Diagnosis Date  . Asthma     Family History  Problem Relation Age of Onset  . Asthma Mother   . Allergic rhinitis Mother   . Asthma Father   . Allergic rhinitis Father   . Asthma Sister   . Allergic rhinitis Sister   . Asthma Brother   . Allergic rhinitis Brother   . Atopy Paternal Grandfather   . Angioedema Neg Hx   . Eczema Neg Hx   . Immunodeficiency Neg Hx   . Urticaria Neg Hx     Social History    Socioeconomic History  . Marital status: Married    Spouse name: Not on file  . Number of children: Not on file  . Years of education: Not on file  . Highest education level: Not on file  Occupational History  . Not on file  Tobacco Use  . Smoking status: Never Smoker  . Smokeless tobacco: Never Used  Vaping Use  . Vaping Use: Never used  Substance and Sexual Activity  . Alcohol use: Yes    Alcohol/week: 0.0 standard drinks    Comment: social drinker   . Drug use: No  . Sexual activity: Not on file  Other Topics Concern  . Not on file  Social History Narrative  . Not on file   Social Determinants of Health   Financial Resource Strain:   . Difficulty of Paying Living Expenses: Not on file  Food Insecurity:   . Worried About Charity fundraiser in the Last Year: Not on file  . Ran Out of Food in the Last Year: Not on file  Transportation Needs:   . Lack of Transportation (Medical): Not on file  . Lack of Transportation (Non-Medical): Not on file  Physical Activity:   . Days of Exercise per Week: Not on file  . Minutes of Exercise per Session: Not on file  Stress:   . Feeling of Stress : Not on file  Social Connections:   . Frequency of Communication with Friends and Family: Not on file  . Frequency of Social Gatherings with Friends and Family: Not on file  . Attends Religious Services: Not on file  . Active Member of Clubs or Organizations: Not on file  . Attends Archivist Meetings: Not on file  . Marital Status: Not on file  Intimate Partner Violence:   . Fear of Current or Ex-Partner: Not on file  . Emotionally Abused: Not on file  . Physically Abused: Not on file  . Sexually Abused: Not on file    I appreciate the opportunity to take part in Skyler's care. Please do not hesitate to contact me with questions.  Sincerely,   R. Edgar Frisk, MD

## 2020-05-15 NOTE — Assessment & Plan Note (Signed)
   Proper technique for stanching epistaxis has been discussed and demonstrated.  Nasal saline spray and/or nasal saline gel is recommended to moisturize nasal mucosa.  The use of a cool-mist humidifier during the night is recommended.  During epistaxis, if needed, oxymetazoline (Afrin) nasal spray may be applied to a cotton ball to help stanch the blood flow.  Follow-up with otolaryngologist.

## 2020-05-15 NOTE — Assessment & Plan Note (Signed)
   Treatment plan as outlined above for allergic rhinitis.  A prescription has been provided for generic Pataday, one drop per eye daily as needed.  If insurance does not cover this medication, medicated allergy eyedrops may be purchased over-the-counter as Pataday Extra Strength or Zaditor.  I have also recommended eye lubricant drops (i.e., Natural Tears) as needed. 

## 2020-05-15 NOTE — Assessment & Plan Note (Signed)
   Continue appropriate aeroallergen avoidance measures.  Levocetirizine, 5 mg daily as needed.  To avoid diminishing benefit with daily use (tachyphylaxis) of second generation antihistamine, consider alternating every few months between fexofenadine (Allegra) and levocetirizine (Xyzal).  Azelastine nasal spray, 1 to 2 sprays per nostril twice daily as needed.  Nasal saline spray (i.e., Simply Saline) or nasal saline lavage (i.e., NeilMed) is recommended as needed and prior to medicated nasal sprays.  For thick post nasal drainage, add guaifenesin 1200 mg (Mucinex Maximum Strength)  twice daily as needed with adequate hydration as discussed.

## 2020-05-15 NOTE — Patient Instructions (Addendum)
Severe persistent asthma  Prednisone has been provided, 40 mg x3 days, 20 mg x1 day, 10 mg x1 day, then stop.  Continue Dulera 200-5 g, 2 inhalations twice daily.  To maximize pulmonary deposition, a spacer has been provided along with instructions for its proper administration with an HFA inhaler.  Restart Spiriva 1.25 mg 2 inhalations daily.  A refill prescription has been provided.  Continue montelukast 10 mg daily.  Continue theophylline 300 mg twice daily.  A theophylline level will be checked.  Continue albuterol every 4-6 hours if needed.  The patient has been asked to contact me if his symptoms persist or progress. Otherwise, he may return for follow up in 2 months.  Other allergic rhinitis  Continue appropriate aeroallergen avoidance measures.  Levocetirizine, 5 mg daily as needed.  To avoid diminishing benefit with daily use (tachyphylaxis) of second generation antihistamine, consider alternating every few months between fexofenadine (Allegra) and levocetirizine (Xyzal).  Azelastine nasal spray, 1 to 2 sprays per nostril twice daily as needed.  Nasal saline spray (i.e., Simply Saline) or nasal saline lavage (i.e., NeilMed) is recommended as needed and prior to medicated nasal sprays.  For thick post nasal drainage, add guaifenesin 1200 mg (Mucinex Maximum Strength)  twice daily as needed with adequate hydration as discussed.  Allergic conjunctivitis  Treatment plan as outlined above for allergic rhinitis.  A prescription has been provided for generic Pataday, one drop per eye daily as needed.  If insurance does not cover this medication, medicated allergy eyedrops may be purchased over-the-counter as Restaurant manager, fast food.  I have also recommended eye lubricant drops (i.e., Natural Tears) as needed.  Epistaxis  Proper technique for stanching epistaxis has been discussed and demonstrated.  Nasal saline spray and/or nasal saline gel is recommended to  moisturize nasal mucosa.  The use of a cool-mist humidifier during the night is recommended.  During epistaxis, if needed, oxymetazoline (Afrin) nasal spray may be applied to a cotton ball to help stanch the blood flow.  Follow-up with otolaryngologist.   Atopic dermatitis  Continue appropriate skin care measures.  Continue Dupixent injections as prescribed.   Continue clobetasol 0.05% sparingly to affected areas as needed.  Follow-up with dermatologist as recommended.   Return in about 2 months (around 07/15/2020), or if symptoms worsen or fail to improve.

## 2020-05-15 NOTE — Assessment & Plan Note (Signed)
   Prednisone has been provided, 40 mg x3 days, 20 mg x1 day, 10 mg x1 day, then stop.  Continue Dulera 200-5 g, 2 inhalations twice daily.  To maximize pulmonary deposition, a spacer has been provided along with instructions for its proper administration with an HFA inhaler.  Restart Spiriva 1.25 mg 2 inhalations daily.  A refill prescription has been provided.  Continue montelukast 10 mg daily.  Continue theophylline 300 mg twice daily.  A theophylline level will be checked.  Continue albuterol every 4-6 hours if needed.  The patient has been asked to contact me if his symptoms persist or progress. Otherwise, he may return for follow up in 2 months.

## 2020-05-16 ENCOUNTER — Encounter: Payer: Self-pay | Admitting: Allergy and Immunology

## 2020-05-16 ENCOUNTER — Other Ambulatory Visit: Payer: Self-pay

## 2020-05-16 DIAGNOSIS — J4551 Severe persistent asthma with (acute) exacerbation: Secondary | ICD-10-CM

## 2020-08-10 ENCOUNTER — Telehealth: Payer: Self-pay | Admitting: *Deleted

## 2020-08-10 NOTE — Telephone Encounter (Signed)
Received FMLA paperwork via mail. Filled out and placed on Dr. Barnett Hatter door.

## 2020-08-15 NOTE — Telephone Encounter (Signed)
FMLA forms filled out and signed by Dr. Verlin Fester. Pt. Informed. Pt will come by 2:30-3:00 tomorrow after work to pick his forms up.

## 2020-11-05 ENCOUNTER — Other Ambulatory Visit: Payer: Self-pay | Admitting: Pediatrics

## 2020-11-06 NOTE — Telephone Encounter (Signed)
Left message for patient to call office to make 6 month follow up visit. Courtesy refill for albuterol x 1 with no refills sent to [facility].

## 2020-12-01 ENCOUNTER — Encounter: Payer: Self-pay | Admitting: Allergy and Immunology

## 2020-12-01 ENCOUNTER — Other Ambulatory Visit: Payer: Self-pay

## 2020-12-01 ENCOUNTER — Ambulatory Visit (INDEPENDENT_AMBULATORY_CARE_PROVIDER_SITE_OTHER): Payer: 59 | Admitting: Allergy and Immunology

## 2020-12-01 VITALS — BP 128/72 | HR 82 | Resp 18

## 2020-12-01 DIAGNOSIS — J3489 Other specified disorders of nose and nasal sinuses: Secondary | ICD-10-CM

## 2020-12-01 DIAGNOSIS — T7800XD Anaphylactic reaction due to unspecified food, subsequent encounter: Secondary | ICD-10-CM

## 2020-12-01 DIAGNOSIS — L2089 Other atopic dermatitis: Secondary | ICD-10-CM

## 2020-12-01 DIAGNOSIS — J4550 Severe persistent asthma, uncomplicated: Secondary | ICD-10-CM | POA: Diagnosis not present

## 2020-12-01 DIAGNOSIS — I780 Hereditary hemorrhagic telangiectasia: Secondary | ICD-10-CM

## 2020-12-01 DIAGNOSIS — D7219 Other eosinophilia: Secondary | ICD-10-CM

## 2020-12-01 DIAGNOSIS — J3089 Other allergic rhinitis: Secondary | ICD-10-CM | POA: Diagnosis not present

## 2020-12-01 MED ORDER — MUPIROCIN 2 % EX OINT: TOPICAL_OINTMENT | CUTANEOUS | 0 refills | Status: DC

## 2020-12-01 NOTE — Progress Notes (Signed)
[facility] [place] [facility] [place]   Follow-up Note  Referring Provider: Kristopher Glee., MD Primary Provider: Kristopher Glee., MD Date of Office Visit: 12/01/2020  Subjective:   Alexander Hodge (DOB: 1961-04-02) is a 60 y.o. 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  His last visit to this clinic with Dr. Verlin Fester was 15 May 2020.  Ever since he started dupilumab about 18 months ago, as directed by his dermatologist, he has had much better control of his multiorgan eosinophilic driven atopic disease.    His skin is under much better control at this point in time.    He has not required a systemic steroid to treat an asthma exacerbation other than his last visit of 15 May 2020.  He rarely uses a short acting bronchodilator.  He does not really exercise to any degree.  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  He did see an ear nose and throat doctor in the past but has not seen a doctor in a year.  He does not eat fish or shellfish.  He has received 3 COVID vaccines and a flu vaccine.  Allergies as of 12/01/2020      Reactions   Eggs Or Egg-derived Products Shortness Of Breath   Eicosapentaenoic Acid (epa) Rash, Hives   Unknown reaction Unknown reaction   Poultry Meal Shortness Of Breath   Fish Allergy Other (See Comments)   Unknown reaction Unknown reaction Unknown reaction   Shellfish Allergy    Fish-derived Products Rash   Unknown reaction      Medication List    albuterol 108 (90 Base) MCG/ACT inhaler Commonly known as: VENTOLIN HFA INHALE 2 PUFFS INTO THE LUNGS EVERY 4 (FOUR) HOURS AS NEEDED FOR WHEEZING OR SHORTNESS OF BREATH.   aminocaproic acid 25 %  solution Commonly known as: AMICAR   ammonium lactate 12 % lotion Commonly known as: LAC-HYDRIN 1(ONE) APPLICATION(S) TOPICAL 2(TWO) TIMES A DAY   atorvastatin 20 MG tablet Commonly known as: LIPITOR Take by mouth.   buPROPion 150 MG 24 hr tablet Commonly known as: WELLBUTRIN XL Take by mouth.   clobetasol cream 0.05 % Commonly known as: TEMOVATE Apply topically.   desonide 0.05 % ointment Commonly known as: DESOWEN Apply topically.   diclofenac Sodium 1 % Gel Commonly known as: VOLTAREN Apply small amount sparingly to the right posterior heel and achilles tendon   diphenhydramine-acetaminophen 25-500 MG Tabs tablet Commonly known as: TYLENOL PM Take by mouth.   Dulera 200-5 MCG/ACT Aero Generic drug: mometasone-formoterol 2 puffs every 12 hours to prevent coughing or wheezing.   DULoxetine 60 MG capsule Commonly known as: CYMBALTA TAKE 1 CAPSULE BY MOUTH ONCE DAILY WITH FOOD   dupilumab 300 MG/2ML prefilled syringe Commonly known as: DUPIXENT Inject into the skin.   EPINEPHrine 0.3 mg/0.3 mL Soaj injection Commonly known as: EPI-PEN Use as directed for severe allergic reaction   ergocalciferol 1.25 MG (50000 UT) capsule Commonly known as: VITAMIN D2 TAKE 1 CAPSULE BY MOUTH ONE TIME PER WEEK   fluticasone 0.05 % cream Commonly known as: CUTIVATE   levalbuterol 1.25 MG/3ML nebulizer solution Commonly known as: XOPENEX INHALE 1 VIAL BY NEBULIZATION EVERY 6 (SIX) HOURS AS NEEDED FOR  WHEEZING.   montelukast 10 MG tablet Commonly known as: SINGULAIR Take 1 tablet once a day for coughing or wheezing.   pantoprazole 40 MG tablet Commonly known as: Protonix Take 1 tablet (40 mg total) by mouth daily for 30 days.   pimecrolimus 1 % cream Commonly known as: ELIDEL   saline Gel APPLY TO NOSE ONCE OR TWICE A DAY FOR NASAL IRRITATION.   Sarna Sensitive 1 % Lotn Generic drug: pramoxine 1(ONE) APPLICATION(S) TOPICAL EVERY DAY   Spiriva Respimat 1.25  MCG/ACT Aers Generic drug: Tiotropium Bromide Monohydrate Inhale 2 puffs into the lungs daily.   tacrolimus 0.1 % ointment Commonly known as: PROTOPIC Apply 1 application topically 2 (two) times daily as needed.   tadalafil 5 MG tablet Commonly known as: CIALIS TAKE 1 TABLET BY MOUTH  DAILY AS NEEDED FOR UP TO  30 DAYS FOR ERECTILE  DYSFUNCTION.   tamoxifen 20 MG tablet Commonly known as: NOLVADEX Take 20 mg by mouth.   tamsulosin 0.4 MG Caps capsule Commonly known as: FLOMAX   theophylline 300 MG 12 hr tablet Commonly known as: THEODUR Take 1 tablet twice daily for coughing or wheezing.   triamcinolone 0.1 % Commonly known as: KENALOG APPLY TO AFFECTED AREA TWICE A DAY AS NEEDED   triamcinolone ointment 0.1 % Commonly known as: KENALOG 1(ONE) APPLICATION(S) TOPICAL 2(TWO) TIMES A DAY TO LEGS FOR A WEEK THEN STOP FOR A WEEK. REPEAT       Past Medical History:  Diagnosis Date  . Asthma     Past Surgical History:  Procedure Laterality Date  . MASS EXCISION  02/21/2012   Procedure: MINOR EXCISION OF MASS;  Surgeon: Myrtha Mantis., MD;  Location: [facility];  Service: Ophthalmology;;        cyst removal  left eye both lids  . portacath removal  07/2017   infection    Review of systems negative except as noted in HPI / PMHx or noted below:  Review of Systems  Constitutional: Negative.   HENT: Negative.   Eyes: Negative.   Respiratory: Negative.   Cardiovascular: Negative.   Gastrointestinal: Negative.   Genitourinary: Negative.   Musculoskeletal: Negative.   Skin: Negative.   Neurological: Negative.   Endo/Heme/Allergies: Negative.   Psychiatric/Behavioral: Negative.      Objective:   Vitals:   12/01/20 1516  BP: 128/72  Pulse: 82  Resp: 18  SpO2: 99%          Physical Exam Constitutional:      Appearance: He is not diaphoretic.  HENT:     Head: Normocephalic.     Right Ear: Tympanic membrane, ear canal and external  ear normal.     Left Ear: Tympanic membrane, ear canal and external ear normal.     Nose: Nose normal. No mucosal edema (Septal perforation with bloody borders) or rhinorrhea.     Mouth/Throat:     Pharynx: Uvula midline. No oropharyngeal exudate.  Eyes:     Conjunctiva/sclera: Conjunctivae normal.  Neck:     Thyroid: No thyromegaly.     Trachea: Trachea normal. No tracheal tenderness or tracheal deviation.  Cardiovascular:     Rate and Rhythm: Normal rate and regular rhythm.     Heart sounds: Normal heart sounds, S1 normal and S2 normal. No murmur heard.   Pulmonary:     Effort: No respiratory distress.     Breath sounds: Normal breath sounds. No stridor. No wheezing or rales.  Lymphadenopathy:  Head:     Right side of head: No tonsillar adenopathy.     Left side of head: No tonsillar adenopathy.     Cervical: No cervical adenopathy.  Skin:    Findings: No erythema or rash.     Nails: There is no clubbing.  Neurological:     Mental Status: He is alert.     Diagnostics:    Spirometry was performed and demonstrated an FEV1 of 1.57 at 56 % of predicted.  Results of blood tests obtained 29 November 2020 identified WBC 5.6, absolute eosinophil 1100, absolute lymphocyte 1000, hemoglobin 12.9, platelet 222  Results of a chest x-ray obtained 04 September 2020 identified the following:  The heart size and mediastinal contours are within normal limits.  Both lungs are clear. No pneumothorax or pleural effusion is noted.  The visualized skeletal structures are unremarkable.    Assessment and Plan:   1. Asthma, severe persistent, well-controlled   2. Other allergic rhinitis   3. Atopic dermatitis   4. Anaphylactic shock due to food, subsequent encounter   5. Hereditary hemorrhagic telangiectasia (HCC)   6. Other eosinophilia   7. Nasal septal perforation     1.  Continue dupilumab injections and all topical agents as directed by dermatology  2.  Continue Dulera 200-2  inhalations twice a day  3.  Continue Spiriva 1.25 Respimat 2 inhalations once a day  4.  Continue montelukast 10 mg daily  5.  Discontinue theophylline  6.  For nose utilize the following:   A. Nasal saline followed by bactroban ointment 3 times a day for 14 days  7. If needed:   A. Albuterol HFA - 2 inhalations every 4-6 hours  B. Cetirizine 10 mg - 1 tablet 2 times per day  8. Return to clinic in 12 weeks or earlier if problem  Brayan appears to have improvement with the use of dupilumab and continued use of anti-inflammatory agents for his airway.  We will attempt to discontinue his theophylline today as he has been doing really well.  He does have eosinophilia which has been a longstanding issue and this may have been made somewhat worse by the use of dupilumab which is not an unusual development.  Overall he appears to be doing very well on his current plan and we will see him back in this clinic in 12 weeks or earlier if there is a problem.    Allena Katz, MD All[facility] [place]

## 2020-12-01 NOTE — Patient Instructions (Addendum)
  1.  Continue dupilumab injections and all topical agents as directed by dermatology  2.  Continue Dulera 200-2 inhalations twice a day  3.  Continue Spiriva 1.25 Respimat 2 inhalations once a day  4.  Continue montelukast 10 mg daily  5.  Discontinue theophylline  6.  For nose utilize the following:   A. Nasal saline followed by bactroban ointment 3 times a day for 14 days  7. If needed:   A. Albuterol HFA - 2 inhalations every 4-6 hours  B. Cetirizine 10 mg - 1 tablet 2 times per day  C. Epi-pen   8. Return to clinic in 12 weeks or earlier if problem

## 2020-12-02 ENCOUNTER — Other Ambulatory Visit: Payer: Self-pay | Admitting: Family Medicine

## 2020-12-04 ENCOUNTER — Encounter: Payer: Self-pay | Admitting: Allergy and Immunology

## 2021-01-20 ENCOUNTER — Other Ambulatory Visit: Payer: Self-pay | Admitting: Pediatrics

## 2021-02-23 ENCOUNTER — Ambulatory Visit: Payer: 59 | Admitting: Allergy and Immunology

## 2021-02-23 DIAGNOSIS — J309 Allergic rhinitis, unspecified: Secondary | ICD-10-CM

## 2021-03-19 ENCOUNTER — Other Ambulatory Visit: Payer: Self-pay | Admitting: *Deleted

## 2021-03-21 ENCOUNTER — Other Ambulatory Visit: Payer: Self-pay

## 2021-03-21 MED ORDER — MONTELUKAST SODIUM 10 MG PO TABS: 10.0000 mg | ORAL_TABLET | Freq: Every day | ORAL | 0 refills | Status: DC

## 2021-03-22 ENCOUNTER — Ambulatory Visit: Payer: 59 | Admitting: Family

## 2021-03-22 DIAGNOSIS — J309 Allergic rhinitis, unspecified: Secondary | ICD-10-CM

## 2021-04-03 ENCOUNTER — Other Ambulatory Visit: Payer: Self-pay | Admitting: Allergy and Immunology

## 2021-04-03 NOTE — Patient Instructions (Signed)
Severe persistent asthma Start prednisone 10 mg taking 2 tablets twice a day for 3 days, then on the fourth day take 2 tablets in the morning, on the fifth day take 1 tablet and stop Continue Dulera 200 mcg 2 puffs twice a day with spacer to help prevent cough and wheeze Decrease Spiriva Respimat 1.25 mcg 2 puffs once a day to help prevent cough and wheeze Continue montelukast 10 mg at night to help prevent cough and wheeze.  May use albuterol 2 puffs every 4-6 hours as needed for cough, wheeze, tightness in chest, or shortness of breath. Asthma control goals:  Full participation in all desired activities (may need albuterol before activity) Albuterol use two time or less a week on average (not counting use with activity) Cough interfering with sleep two time or less a month Oral steroids no more than once a year No hospitalizations  Allergic rhinitis Continue cetirizine 10 mg once a day as needed for runny nose/itching  Atopic dermatitis Continue dupilumab injections and topical agents as per her dermatologist  Anaphylactic shock due to food Avoid fish and shellfish. In case of an allergic reaction, give Benadryl 4 teaspoonfuls every 4 hours, and if life-threatening symptoms occur, inject with EpiPen 0.3 mg.  Epistaxis/hereditary hemorrhagic telangiectasia We will refer you to see Dr. Laurance Flatten, ENT, who you have seen in the past concerning your epistaxis and hereditary hemorrhagic telangiectasia. You may use saline nasal gel as needed  Please let us know if this treatment plan is not working well for you Schedule a follow-up appointment in 2 months or sooner if needed

## 2021-04-04 ENCOUNTER — Other Ambulatory Visit: Payer: Self-pay

## 2021-04-04 ENCOUNTER — Encounter: Payer: Self-pay | Admitting: Family

## 2021-04-04 ENCOUNTER — Ambulatory Visit: Payer: 59 | Admitting: Family

## 2021-04-04 VITALS — BP 118/78 | HR 81 | Temp 98.1°F | Resp 17 | Ht 66.0 in | Wt 159.8 lb

## 2021-04-04 DIAGNOSIS — I780 Hereditary hemorrhagic telangiectasia: Secondary | ICD-10-CM

## 2021-04-04 DIAGNOSIS — T7800XD Anaphylactic reaction due to unspecified food, subsequent encounter: Secondary | ICD-10-CM

## 2021-04-04 DIAGNOSIS — J4551 Severe persistent asthma with (acute) exacerbation: Secondary | ICD-10-CM | POA: Diagnosis not present

## 2021-04-04 DIAGNOSIS — D7219 Other eosinophilia: Secondary | ICD-10-CM

## 2021-04-04 DIAGNOSIS — J3089 Other allergic rhinitis: Secondary | ICD-10-CM

## 2021-04-04 MED ORDER — EPINEPHRINE 0.3 MG/0.3ML IJ SOAJ: INTRAMUSCULAR | 1 refills | Status: DC

## 2021-04-04 MED ORDER — DULERA 200-5 MCG/ACT IN AERO: INHALATION_SPRAY | RESPIRATORY_TRACT | 1 refills | Status: DC

## 2021-04-04 NOTE — Progress Notes (Signed)
[address] [place] [address] Dept: [phone]  FOLLOW UP NOTE  Patient ID: Alexander Hodge, male    DOB: October 10, 1960  Age: 60 y.o. MRN: [id number] Date of Office Visit: 04/04/2021  Assessment  Chief Complaint: Follow-up, Wheezing, Nasal Congestion (Stuffy nose), and Cough (Dry coughs, hoarseness, itchy throat and ears, night time coughing. Took some mucinex which dry it out.)  HPI Alexander Hodge is a 60 year old male who presents today for follow-up of severe persistent asthma well-controlled, allergic rhinitis, atopic dermatitis, anaphylactic shock due to food, hereditary hemorrhagic telangiectasia, eosinophilia,  and nasal septal perforation.  He was last seen on December 01, 2020 by Dr. Neldon Mc.  Severe persistent asthma is reported as not well controlled with Dulera 200 mcg 2 puffs twice a day, Spiriva Respimat 1.25 mcg 2 puffs twice a day, montelukast 10 mg once a day, dupilumab injections per dermatology, and albuterol as needed.  He reports for the past couple weeks he has had more coughing and more wheezing than usual.  The cough is described as dry and worse at night.  If he does get anything up with his cough it is clear in color.  He also reports wheezing, nocturnal awakenings due to cough, and shortness of breath only with the coughing alot.  He denies any tightness in his chest and fever.  He reports that his albuterol does help with his coughing and wheezing.  He has also tried using cough drops and Ryclora.  He has been using his albuterol approximately 6 times a week.  Since his last office visit he has not required any systemic steroids or made any trips to the emergency room or urgent care due to breathing problems.  Atopic dermatitis is reported as moderately controlled.  He continues to get dupilumab injections and use topical agents as prescribed by dermatology.  He reports that his face is cleared up legs will flake up if he does not use his creams.  He continues to  avoid fish and shellfish without any accidental ingestion or use of his epinephrine autoinjector device.  He is requesting a refill on his epinephrine autoinjector device.  Allergic rhinitis is reported as moderately controlled with Zyrtec 10 mg once a day.  He reports nasal congestion, episodes of epistaxis, occasional sneezing with blood, and sometimes blowing his nose and seeing blood.  He reports that he has hereditary hemorrhagic telangiectasia.  He has seen Dr. Laurance Flatten, ENT in the past for this but it has been a while since he has seen him.  Nasal septal perforation is reported as doing better.  He reports that he used the Bactroban as prescribed by Dr. Neldon Mc.   Drug Allergies:  Allergies  Allergen Reactions   Eggs Or Egg-Derived Products Shortness Of Breath   Eicosapentaenoic Acid (Epa) Rash and Hives    Unknown reaction Unknown reaction   Poultry Meal Shortness Of Breath   Fish Allergy Other (See Comments)    Unknown reaction Unknown reaction Unknown reaction   Shellfish Allergy    Fish-Derived Products Rash    Unknown reaction    Review of Systems: Review of Systems  Constitutional:  Positive for chills. Negative for fever.       Reports that he always has chills since being diagnosed with anemia  HENT:         Reports episodes of epistaxis,seeing blood when he blows his nose at times, and when he sneezes he will see some blood from his nose.  He also  reports nasal congestion.  He denies rhinorrhea and postnasal drip  Eyes:        Denies itchy watery eyes  Respiratory:  Positive for cough, shortness of breath and wheezing.        Reports a dry cough that is worse at night.  If at times his cough is productive it is clear in color.  He also reports wheezing, nocturnal awakenings due to cough, and shortness of breath only with a lot of coughing.  He denies tightness in his chest  Cardiovascular:  Negative for chest pain and palpitations.  Gastrointestinal:        Reports  heartburn and reflux at times.  Has tried Pepcid.  Genitourinary:  Negative for dysuria.  Skin:  Negative for itching and rash.  Neurological:  Positive for headaches.       Reports occasional headaches  Endo/Heme/Allergies:  Positive for environmental allergies.    Physical Exam: BP 118/78 (BP Location: Right Arm, Patient Position: Sitting, Cuff Size: Normal)   Pulse 81   Temp 98.1 F (36.7 C) (Temporal)   Resp 17   Ht 5\' 6"  (1.676 m)   Wt 159 lb 12.8 oz (72.5 kg)   SpO2 96%   BMI 25.79 kg/m    Physical Exam Constitutional:      Appearance: Normal appearance.  HENT:     Head: Normocephalic and atraumatic.     Comments: Pharynx normal, eyes normal, ears normal, nose: Blood seen in both nares.    Right Ear: Tympanic membrane, ear canal and external ear normal.     Left Ear: Tympanic membrane, ear canal and external ear normal.     Mouth/Throat:     Mouth: Mucous membranes are moist.     Pharynx: Oropharynx is clear.  Eyes:     Conjunctiva/sclera: Conjunctivae normal.  Cardiovascular:     Rate and Rhythm: Normal rate and regular rhythm.     Heart sounds: Normal heart sounds.  Pulmonary:     Effort: Pulmonary effort is normal.     Breath sounds: Normal breath sounds.     Comments: Lungs clear to auscultation Musculoskeletal:     Cervical back: Neck supple.  Skin:    General: Skin is warm.  Neurological:     Mental Status: He is alert and oriented to person, place, and time.  Psychiatric:        Mood and Affect: Mood normal.        Behavior: Behavior normal.        Thought Content: Thought content normal.        Judgment: Judgment normal.    Diagnostics: FVC 2.51 L, FEV1 1.68 L.  Predicted FVC 3.39 L, predicted FVC 3.39 L, predicted FEV1 2.68 L.  Spirometry indicates normal ventilatory function.  Spirometry is consistent with previous spirometry  Assessment and Plan: 1. Severe persistent asthma with acute exacerbation   2. Other allergic rhinitis   3.  Anaphylactic shock due to food, subsequent encounter   4. Hereditary hemorrhagic telangiectasia (HCC)   5. Other eosinophilia     Meds ordered this encounter  Medications   EPINEPHrine 0.3 mg/0.3 mL IJ SOAJ injection    Sig: Use as directed for severe allergic reaction    Dispense:  2 each    Refill:  1    Dispense mylan generic or brand name whichever is cheaper for the pt. Do not fill the adrenaclick. Please keep rx on hold. Pt. Will call when needed.   mometasone-formoterol (DULERA) 200-5  MCG/ACT AERO    Sig: 2 puffs every 12 hours to prevent coughing or wheezing.    Dispense:  39 g    Refill:  1    Please dispense 90 day supply.     Patient Instructions  Severe persistent asthma Start prednisone 10 mg taking 2 tablets twice a day for 3 days, then on the fourth day take 2 tablets in the morning, on the fifth day take 1 tablet and stop Continue Dulera 200 mcg 2 puffs twice a day with spacer to help prevent cough and wheeze Decrease Spiriva Respimat 1.25 mcg 2 puffs once a day to help prevent cough and wheeze Continue montelukast 10 mg at night to help prevent cough and wheeze.  May use albuterol 2 puffs every 4-6 hours as needed for cough, wheeze, tightness in chest, or shortness of breath. Asthma control goals:  Full participation in all desired activities (may need albuterol before activity) Albuterol use two time or less a week on average (not counting use with activity) Cough interfering with sleep two time or less a month Oral steroids no more than once a year No hospitalizations  Allergic rhinitis Continue cetirizine 10 mg once a day as needed for runny nose/itching  Atopic dermatitis Continue dupilumab injections and topical agents as per her dermatologist  Anaphylactic shock due to food Avoid fish and shellfish. In case of an allergic reaction, give Benadryl 4 teaspoonfuls every 4 hours, and if life-threatening symptoms occur, inject with EpiPen 0.3  mg.  Epistaxis/hereditary hemorrhagic telangiectasia We will refer you to see Dr. Laurance Flatten, ENT, who you have seen in the past concerning your epistaxis and hereditary hemorrhagic telangiectasia. You may use saline nasal gel as needed  Please let us know if this treatment plan is not working well for you Schedule a follow-up appointment in 2 months or sooner if needed   Return in about 2 months (around 06/05/2021), or if symptoms worsen or fail to improve.    Thank you for the opportunity to care for this patient.  Please do not hesitate to contact me with questions.  Althea Charon, FNP [facility]

## 2021-04-05 ENCOUNTER — Telehealth: Payer: Self-pay | Admitting: Family

## 2021-04-05 NOTE — Telephone Encounter (Signed)
-----   Message from Althea Charon, Evangeline sent at 04/04/2021  3:51 PM EDT ----- Refer to ENT, Dr. Laurance Flatten (known to patient) re: epistaxis and hereditary hemorrhagic telangiectasia

## 2021-04-05 NOTE — Telephone Encounter (Addendum)
Referral, notes, and demographics have been faxed to Dr. Hassell Done ENT at [phone]. Left detailed message informing patient of this information.  [facility]- [facility] [address] [place] [address] [place] [address] [phone]

## 2021-06-05 ENCOUNTER — Ambulatory Visit: Payer: 59 | Admitting: Family

## 2021-06-21 ENCOUNTER — Ambulatory Visit: Payer: 59 | Admitting: Family

## 2021-07-02 NOTE — Progress Notes (Signed)
FOLLOW UP Date of Service/Encounter:  07/05/21   Subjective:  Alexander Hodge (DOB: 26-Apr-1961) is a 60 y.o. male who returns to the [facility] [place] on 07/05/2021 in re-evaluation of the following: Severe persistent asthma, fish and shellfish allergy, allergic rhinitis History obtained from: chart review and patient.  For Review, patient with a history of hereditary hemorrhagic telangiectasia.  LV was on 04/04/21  with Althea Charon, FNP seen for  -severe persistent asthma (not controlled on Dulera 200, Spiriva, montelukast, dupilumab (prescribed by dermatology), and albuterol as needed).  Given prednisone Dosepak at last visit.He has been on Xolair in the past. -Atopic dermatitis managed by dermatology and on Dupixent. 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 -Allergic rhinitis (moderately controlled on Zyrtec) -Nasal septum perforation uses Bactroban as needed.  We referred to Dr. Laurance Flatten for management of this as well as HHT.  Previous diagnostics:  Most recent spirometry 07.13.22- FEV1 1.68 (63%), FEVR 88%  Today reports for follow-up and states that he feels his breathing is stable.  He uses albuterol once last week.  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  He does not have any issues when he eats these foods with the exception of the dairy consumption which causes upset stomach if he eats too much.  He is taking his Dulera 200, Spiriva, and Dupixent as prescribed.  He has been prescribed at least 2 systemic steroid courses this year for his asthma. -His atopic dermatitis has almost melted with the use of Dupixent.  He continues to have dry skin, but otherwise is not having flares.  This is managed by dermatology. -He continues to avoid fish and shellfish without accidental exposures. -His allergic rhinitis is currently not controlled and he is having increased  congestion.  He cannot use nasal sprays because of his HHT.  He does use nasal saline rinses and AR gel.  Allergies as of 07/05/2021       Reactions   Eggs Or Egg-derived Products Shortness Of Breath   Eicosapentaenoic Acid (epa) Rash, Hives   Unknown reaction Unknown reaction   Poultry Meal Shortness Of Breath   Fish Allergy Other (See Comments)   Unknown reaction Unknown reaction Unknown reaction   Shellfish Allergy    Fish-derived Products Rash   Unknown reaction        Medication List        Accurate as of July 05, 2021  6:11 PM. If you have any questions, ask your nurse or doctor.          albuterol 108 (90 Base) MCG/ACT inhaler Commonly known as: VENTOLIN HFA INHALE 2 PUFFS INTO THE LUNGS EVERY 4 HOURS AS NEEDED FOR WHEEZING OR SHORTNESS OF BREATH.   alendronate 70 MG tablet Commonly known as: FOSAMAX Take 70 mg by mouth once a week.   aminocaproic acid 25 % solution Commonly known as: AMICAR   ammonium lactate 12 % lotion Commonly known as: LAC-HYDRIN 1(ONE) APPLICATION(S) TOPICAL 2(TWO) TIMES A DAY   atorvastatin 20 MG tablet Commonly known as: LIPITOR Take by mouth.   buPROPion 150 MG 24 hr tablet Commonly known as: WELLBUTRIN XL Take by mouth.   clobetasol cream 0.05 % Commonly known as: TEMOVATE Apply topically.   cyclobenzaprine 5 MG tablet Commonly known as: FLEXERIL Take 10 mg by mouth at bedtime.   desonide 0.05 % ointment  Commonly known as: DESOWEN Apply topically.   diclofenac Sodium 1 % Gel Commonly known as: VOLTAREN Apply small amount sparingly to the right posterior heel and achilles tendon   diphenhydramine-acetaminophen 25-500 MG Tabs tablet Commonly known as: TYLENOL PM Take by mouth.   Dulera 200-5 MCG/ACT Aero Generic drug: mometasone-formoterol 2 puffs every 12 hours to prevent coughing or wheezing.   DULoxetine 60 MG capsule Commonly known as: CYMBALTA TAKE 1 CAPSULE BY MOUTH ONCE DAILY WITH FOOD    dupilumab 300 MG/2ML prefilled syringe Commonly known as: DUPIXENT Inject into the skin.   EPINEPHrine 0.3 mg/0.3 mL Soaj injection Commonly known as: EPI-PEN Use as directed for severe allergic reaction   ergocalciferol 1.25 MG (50000 UT) capsule Commonly known as: VITAMIN D2 TAKE 1 CAPSULE BY MOUTH ONE TIME PER WEEK   fluticasone 0.05 % cream Commonly known as: CUTIVATE   levalbuterol 1.25 MG/3ML nebulizer solution Commonly known as: XOPENEX INHALE 1 VIAL BY NEBULIZATION EVERY 6 (SIX) HOURS AS NEEDED FOR WHEEZING.   montelukast 10 MG tablet Commonly known as: SINGULAIR TAKE 1 TABLET BY MOUTH AT  BEDTIME   mupirocin ointment 2 % Commonly known as: BACTROBAN Apply to affected areas inside nose 3 times daily for 14 days   pantoprazole 40 MG tablet Commonly known as: Protonix Take 1 tablet (40 mg total) by mouth daily for 30 days.   pimecrolimus 1 % cream Commonly known as: ELIDEL   pregabalin 100 MG capsule Commonly known as: LYRICA Take 100 mg by mouth 3 (three) times daily.   saline Gel APPLY TO NOSE ONCE OR TWICE A DAY FOR NASAL IRRITATION.   Sarna Sensitive 1 % Lotn Generic drug: pramoxine 1(ONE) APPLICATION(S) TOPICAL EVERY DAY   sildenafil 50 MG tablet Commonly known as: VIAGRA Take 50 mg by mouth daily as needed.   Spiriva Respimat 1.25 MCG/ACT Aers Generic drug: Tiotropium Bromide Monohydrate Inhale 2 puffs into the lungs daily.   tacrolimus 0.1 % ointment Commonly known as: PROTOPIC Apply 1 application topically 2 (two) times daily as needed.   tadalafil 5 MG tablet Commonly known as: CIALIS TAKE 1 TABLET BY MOUTH  DAILY AS NEEDED FOR UP TO  30 DAYS FOR ERECTILE  DYSFUNCTION.   tamoxifen 20 MG tablet Commonly known as: NOLVADEX Take 20 mg by mouth.   tamsulosin 0.4 MG Caps capsule Commonly known as: FLOMAX   theophylline 300 MG 12 hr tablet Commonly known as: THEODUR Take 1 tablet twice daily for coughing or wheezing.   triamcinolone  cream 0.1 % Commonly known as: KENALOG APPLY TO AFFECTED AREA TWICE A DAY AS NEEDED       Past Medical History:  Diagnosis Date   Asthma    Past Surgical History:  Procedure Laterality Date   MASS EXCISION  02/21/2012   Procedure: MINOR EXCISION OF MASS;  Surgeon: Myrtha Mantis., MD;  Location: [facility];  Service: Ophthalmology;;        cyst removal  left eye both lids   portacath removal  07/2017   infection   Otherwise, there have been no changes to his past medical history, surgical history, family history, or social history.  ROS: All others negative except as noted per HPI.   Objective:  BP 116/72   Pulse 73   Temp 98.4 F (36.9 C)   Resp 16   Ht 5\' 7"  (1.702 m)   Wt 168 lb 6.4 oz (76.4 kg)   SpO2 98%   BMI 26.38 kg/m  Body mass index is 26.38 kg/m. Physical Exam: General Appearance:  Alert, cooperative, no distress, appears stated age  Head:  Normocephalic, without obvious abnormality, atraumatic  Eyes:  Conjunctiva clear, EOM's intact  Nose: Nares normal  Throat: Lips, tongue normal; teeth and gums normal  Neck: Supple, symmetrical  Lungs:   Clear to auscultation bilaterally, but airways are tight and prolonged expiratory phase, respirations unlabored, no coughing  Heart:  Regular rate and rhythm, no murmur, appears well perfused  Extremities: No edema  Skin: Xerosis  Neurologic: No gross deficits   Spirometry:  Tracings reviewed. His effort: Good reproducible efforts. FVC: 1.85L FEV1: 1.24L, 46% predicted FEV1/FVC ratio: 72% Interpretation: Spirometry consistent with severe obstructive disease.  Please see scanned spirometry results for details.  Assessment:  Severe persistent asthma with acute exacerbation - Plan: Spirometry with Graph, methylPREDNISolone acetate (DEPO-MEDROL) injection 80 mg -His FEV1 today is abysmal, and lower than last visit.  He is moving adequate air but his airway sound tight and he has prolonged  expiratory phase.  Today is my first day seeing him.  He does not perceive that his asthma is out of control.  He does however feel he would benefit from steroids.  Other allergic rhinitis -Not controlled, will add Mucinex for increased drainage and encouraged him to continue nasal saline rinses and Ayr gel  Anaphylactic shock due to food, subsequent encounter -Stable  Atopic dermatitis -Stable, managed by dermatology  Plan/Recommendations:   Patient Instructions  Severe persistent asthma Depomedrol 80 mg IM today in clinic Your lung testing did not look good today Continue Dulera 200 mcg 2 puffs twice a day with spacer to help prevent cough and wheeze Continue Spiriva Respimat 1.25 mcg 2 puffs once a day to help prevent cough and wheeze Continue montelukast 10 mg at night to help prevent cough and wheeze.  May use albuterol 2 puffs every 4-6 hours as needed for cough, wheeze, tightness in chest, or shortness of breath. Asthma control goals:  Full participation in all desired activities (may need albuterol before activity) Albuterol use two time or less a week on average (not counting use with activity) Cough interfering with sleep two time or less a month Oral steroids no more than once a year No hospitalizations Will try approval for Tezpire (tezepulamab)  Allergic rhinitis Continue cetirizine 10 mg once a day as needed for runny nose/itching Add Mucinex 600 mg (2 tablet) every 12 hours as needed for the next 3 to 4 days, take with lots of water Continue Ayr gel for nose as well as nasal saline rinses  Atopic dermatitis Continue dupilumab injections and topical agents as per her dermatologist  Anaphylactic shock due to food Avoid fish and shellfish. In case of an allergic reaction, give Benadryl 4 teaspoonfuls every 4 hours, and if life-threatening symptoms occur, inject with EpiPen 0.3 mg.  Epistaxis/hereditary hemorrhagic telangiectasia Continue with ENT for this You may  use saline nasal gel as needed  Please let us know if this treatment plan is not working well for you Schedule a follow-up appointment in 6-8 weeks or sooner if needed  Sigurd Sos, MD  [facility]

## 2021-07-05 ENCOUNTER — Ambulatory Visit: Payer: 59 | Admitting: Internal Medicine

## 2021-07-05 ENCOUNTER — Other Ambulatory Visit: Payer: Self-pay

## 2021-07-05 ENCOUNTER — Encounter: Payer: Self-pay | Admitting: Internal Medicine

## 2021-07-05 VITALS — BP 116/72 | HR 73 | Temp 98.4°F | Resp 16 | Ht 67.0 in | Wt 168.4 lb

## 2021-07-05 DIAGNOSIS — J4551 Severe persistent asthma with (acute) exacerbation: Secondary | ICD-10-CM

## 2021-07-05 DIAGNOSIS — L2089 Other atopic dermatitis: Secondary | ICD-10-CM

## 2021-07-05 DIAGNOSIS — T7800XD Anaphylactic reaction due to unspecified food, subsequent encounter: Secondary | ICD-10-CM

## 2021-07-05 DIAGNOSIS — J3089 Other allergic rhinitis: Secondary | ICD-10-CM | POA: Diagnosis not present

## 2021-07-05 MED ORDER — METHYLPREDNISOLONE ACETATE 80 MG/ML IJ SUSP
80.0000 mg | Freq: Once | INTRAMUSCULAR | Status: AC
  Administered 2021-07-05: 80 mg via INTRAMUSCULAR

## 2021-07-05 NOTE — Patient Instructions (Addendum)
Severe persistent asthma Depomedrol 80 mg IM today in clinic Your lung testing did not look good today Continue Dulera 200 mcg 2 puffs twice a day with spacer to help prevent cough and wheeze Continue Spiriva Respimat 1.25 mcg 2 puffs once a day to help prevent cough and wheeze Continue montelukast 10 mg at night to help prevent cough and wheeze.  May use albuterol 2 puffs every 4-6 hours as needed for cough, wheeze, tightness in chest, or shortness of breath. Asthma control goals:  Full participation in all desired activities (may need albuterol before activity) Albuterol use two time or less a week on average (not counting use with activity) Cough interfering with sleep two time or less a month Oral steroids no more than once a year No hospitalizations Will try approval for Tezpire (tezepulamab)  Allergic rhinitis Continue cetirizine 10 mg once a day as needed for runny nose/itching Add Mucinex 600 mg (2 tablet) every 12 hours as needed for the next 3 to 4 days, take with lots of water Continue Ayr gel for nose as well as nasal saline rinses  Atopic dermatitis Continue dupilumab injections and topical agents as per her dermatologist  Anaphylactic shock due to food Avoid fish and shellfish. In case of an allergic reaction, give Benadryl 4 teaspoonfuls every 4 hours, and if life-threatening symptoms occur, inject with EpiPen 0.3 mg.  Epistaxis/hereditary hemorrhagic telangiectasia Continue with ENT for this You may use saline nasal gel as needed  Please let us know if this treatment plan is not working well for you Schedule a follow-up appointment in 6-8 weeks or sooner if needed

## 2021-07-09 ENCOUNTER — Telehealth: Payer: Self-pay | Admitting: *Deleted

## 2021-07-09 NOTE — Telephone Encounter (Signed)
-----   Message from Sigurd Sos, MD sent at 07/05/2021  6:16 PM EDT ----- Hi Ulys Favia.  Can we try to get him approved for Tezpire.  He is on Dupixent for eczema managed by Derm, but his asthma is NOT controlled.  He already failed Xolair.  Has had 3 rounds systemic steroids this year.  FEV1 46%

## 2021-07-09 NOTE — Telephone Encounter (Signed)
L/m for patient to advise approval for Tezspire and need email in order to sign patient up for copay card

## 2021-07-10 NOTE — Telephone Encounter (Signed)
Patient called and I advised approval, copay card and submit for Tezspire, will reach  out patient once delivery set to make appt to start therapy

## 2021-07-11 NOTE — Telephone Encounter (Signed)
Awesome! Thanks

## 2021-07-30 ENCOUNTER — Telehealth: Payer: Self-pay | Admitting: Internal Medicine

## 2021-07-30 NOTE — Telephone Encounter (Signed)
Pt would like a call back before Thursday concerning his injections.

## 2021-07-30 NOTE — Telephone Encounter (Signed)
Patient states, that he gets Dupixent from another office and Dr. Simona Huh is trying to get him approved for Tespire. He was concerned that one wouldn't get approved by his insurance. Gave him Tammy's phone number to contact her for any questions about approval for Tespire.

## 2021-07-31 NOTE — Telephone Encounter (Signed)
Patient's Tezspire came in this morning. Tried to contact patient to schedule an appointment; had to leave message.

## 2021-08-02 ENCOUNTER — Ambulatory Visit (INDEPENDENT_AMBULATORY_CARE_PROVIDER_SITE_OTHER): Payer: 59

## 2021-08-02 ENCOUNTER — Other Ambulatory Visit: Payer: Self-pay

## 2021-08-02 DIAGNOSIS — J4550 Severe persistent asthma, uncomplicated: Secondary | ICD-10-CM

## 2021-08-02 NOTE — Progress Notes (Signed)
Patient started Alexander Hodge today(210mg /1.91 mL) Q 2 weeks. no problems after 30 minute wait. Patient receives Dupixent from dermatologist, advise patient to wait 48 hours in between shots.

## 2021-08-09 MED ORDER — TEZEPELUMAB-EKKO 210 MG/1.91ML ~~LOC~~ SOSY
210.0000 mg | PREFILLED_SYRINGE | SUBCUTANEOUS | Status: AC
  Administered 2021-08-02 – 2024-10-07 (×18): 210 mg via SUBCUTANEOUS

## 2021-08-09 NOTE — Addendum Note (Signed)
Addended by: Carin Hock on: 08/09/2021 12:31 PM   Modules accepted: Orders

## 2021-08-20 ENCOUNTER — Ambulatory Visit: Payer: 59 | Admitting: Internal Medicine

## 2021-08-20 DIAGNOSIS — J309 Allergic rhinitis, unspecified: Secondary | ICD-10-CM

## 2021-08-20 NOTE — Progress Notes (Deleted)
FOLLOW UP Date of Service/Encounter:  08/20/21   Subjective:  Alexander Hodge (DOB: May 14, 1961) is a 60 y.o. male with a history of hereditary hemorrhagic telangiectasia who returns to the [facility] on 08/20/2021 in re-evaluation of the following: Severe persistent asthma, fish and shellfish allergy, allergic rhinitis History obtained from: chart review and {Persons; PED relatives w/patient:[id number]::"patient"}.  For Review, LV was on 07/05/2021 with Dr.Dusty Raczkowski seen for severe persistent asthma (not controlled on Dulera 200, Spiriva, montelukast or dupilumab).  Additionally he has atopic dermatitis managed by dermatology and being treated with [place].  He has allergy to fish and shellfish which he avoids.  He has allergic rhinitis as well as nasal septal perforation using Bactroban as needed and following with Dr. Laurance Flatten in ENT for this as well as his HHT. At last visit we discussed adding Tezspire to help with asthma control. FEV1 at last visit 46% predicted  Today presents for follow-up. He has started on Tezspire on 08/02/2021.  He continues on Dupixent for his atopic dermatitis. Allergies as of 08/20/2021       Reactions   Eggs Or Egg-derived Products Shortness Of Breath   Eicosapentaenoic Acid (epa) Rash, Hives   Unknown reaction Unknown reaction   Poultry Meal Shortness Of Breath   Fish Allergy Other (See Comments)   Unknown reaction Unknown reaction Unknown reaction   Shellfish Allergy    Fish-derived Products Rash   Unknown reaction        Medication List        Accurate as of August 20, 2021  8:49 AM. If you have any questions, ask your nurse or doctor.          albuterol 108 (90 Base) MCG/ACT inhaler Commonly known as: VENTOLIN HFA INHALE 2 PUFFS INTO THE LUNGS EVERY 4 HOURS AS NEEDED FOR WHEEZING OR SHORTNESS OF BREATH.   alendronate 70 MG tablet Commonly known as: FOSAMAX Take 70 mg by mouth once a week.   aminocaproic acid 25 %  solution Commonly known as: AMICAR   ammonium lactate 12 % lotion Commonly known as: LAC-HYDRIN 1(ONE) APPLICATION(S) TOPICAL 2(TWO) TIMES A DAY   atorvastatin 20 MG tablet Commonly known as: LIPITOR Take by mouth.   buPROPion 150 MG 24 hr tablet Commonly known as: WELLBUTRIN XL Take by mouth.   clobetasol cream 0.05 % Commonly known as: TEMOVATE Apply topically.   cyclobenzaprine 5 MG tablet Commonly known as: FLEXERIL Take 10 mg by mouth at bedtime.   desonide 0.05 % ointment Commonly known as: DESOWEN Apply topically.   diclofenac Sodium 1 % Gel Commonly known as: VOLTAREN Apply small amount sparingly to the right posterior heel and achilles tendon   diphenhydramine-acetaminophen 25-500 MG Tabs tablet Commonly known as: TYLENOL PM Take by mouth.   Dulera 200-5 MCG/ACT Aero Generic drug: mometasone-formoterol 2 puffs every 12 hours to prevent coughing or wheezing.   DULoxetine 60 MG capsule Commonly known as: CYMBALTA TAKE 1 CAPSULE BY MOUTH ONCE DAILY WITH FOOD   dupilumab 300 MG/2ML prefilled syringe Commonly known as: DUPIXENT Inject into the skin.   EPINEPHrine 0.3 mg/0.3 mL Soaj injection Commonly known as: EPI-PEN Use as directed for severe allergic reaction   ergocalciferol 1.25 MG (50000 UT) capsule Commonly known as: VITAMIN D2 TAKE 1 CAPSULE BY MOUTH ONE TIME PER WEEK   fluticasone 0.05 % cream Commonly known as: CUTIVATE   levalbuterol 1.25 MG/3ML nebulizer solution Commonly known as: XOPENEX INHALE 1 VIAL BY NEBULIZATION EVERY 6 (SIX) HOURS  AS NEEDED FOR WHEEZING.   montelukast 10 MG tablet Commonly known as: SINGULAIR TAKE 1 TABLET BY MOUTH AT  BEDTIME   mupirocin ointment 2 % Commonly known as: BACTROBAN Apply to affected areas inside nose 3 times daily for 14 days   pantoprazole 40 MG tablet Commonly known as: Protonix Take 1 tablet (40 mg total) by mouth daily for 30 days.   pimecrolimus 1 % cream Commonly known as: ELIDEL    pregabalin 100 MG capsule Commonly known as: LYRICA Take 100 mg by mouth 3 (three) times daily.   saline Gel APPLY TO NOSE ONCE OR TWICE A DAY FOR NASAL IRRITATION.   Sarna Sensitive 1 % Lotn Generic drug: pramoxine 1(ONE) APPLICATION(S) TOPICAL EVERY DAY   sildenafil 50 MG tablet Commonly known as: VIAGRA Take 50 mg by mouth daily as needed.   Spiriva Respimat 1.25 MCG/ACT Aers Generic drug: Tiotropium Bromide Monohydrate Inhale 2 puffs into the lungs daily.   tacrolimus 0.1 % ointment Commonly known as: PROTOPIC Apply 1 application topically 2 (two) times daily as needed.   tadalafil 5 MG tablet Commonly known as: CIALIS TAKE 1 TABLET BY MOUTH  DAILY AS NEEDED FOR UP TO  30 DAYS FOR ERECTILE  DYSFUNCTION.   tamoxifen 20 MG tablet Commonly known as: NOLVADEX Take 20 mg by mouth.   tamsulosin 0.4 MG Caps capsule Commonly known as: FLOMAX   theophylline 300 MG 12 hr tablet Commonly known as: THEODUR Take 1 tablet twice daily for coughing or wheezing.   triamcinolone cream 0.1 % Commonly known as: KENALOG APPLY TO AFFECTED AREA TWICE A DAY AS NEEDED       Past Medical History:  Diagnosis Date   Asthma    Past Surgical History:  Procedure Laterality Date   MASS EXCISION  02/21/2012   Procedure: MINOR EXCISION OF MASS;  Surgeon: Myrtha Mantis., MD;  Location: [facility];  Service: Ophthalmology;;        cyst removal  left eye both lids   portacath removal  07/2017   infection   Otherwise, there have been no changes to his past medical history, surgical history, family history, or social history.  ROS: All others negative except as noted per HPI.   Objective:  There were no vitals taken for this visit. There is no height or weight on file to calculate BMI. Physical Exam: General Appearance:  Alert, cooperative, no distress, appears stated age  Head:  Normocephalic, without obvious abnormality, atraumatic  Eyes:  Conjunctiva clear,  EOM's intact  Nose: Nares normal  Throat: Lips, tongue normal; teeth and gums normal  Neck: Supple, symmetrical  Lungs:   Respirations unlabored, no coughing  Heart:  Appears well perfused  Extremities: No edema  Skin: Skin color, texture, turgor normal, no rashes or lesions on visualized portions of skin  Neurologic: No gross deficits   Reviewed: ***  Spirometry:  Tracings reviewed. His effort: {Blank single:19197::"Good reproducible efforts.","It was hard to get consistent efforts and there is a question as to whether this reflects a maximal maneuver.","Poor effort, data can not be interpreted."} FVC: ***L FEV1: ***L, ***% predicted FEV1/FVC ratio: ***% Interpretation: {Blank single:19197::"Spirometry consistent with mild obstructive disease","Spirometry consistent with moderate obstructive disease","Spirometry consistent with severe obstructive disease","Spirometry consistent with possible restrictive disease","Spirometry consistent with mixed obstructive and restrictive disease","Spirometry uninterpretable due to technique","Spirometry consistent with normal pattern","No overt abnormalities noted given today's efforts"}.  Please see scanned spirometry results for details.  Skin Testing: {Blank single:19197::"Select foods","Environmental allergy  panel","Environmental allergy panel and select foods","Food allergy panel","None","Deferred due to recent antihistamines use"}. Positive test to: ***. Negative test to: ***.  Results discussed with patient/family.   {Blank single:19197::"Allergy testing results were read and interpreted by myself, documented by clinical staff."," "}  Assessment/Plan  There are no Patient Instructions on file for this visit.  No follow-ups on file.  Sigurd Sos, MD  [facility]

## 2021-08-29 ENCOUNTER — Other Ambulatory Visit: Payer: Self-pay

## 2021-08-29 ENCOUNTER — Ambulatory Visit (INDEPENDENT_AMBULATORY_CARE_PROVIDER_SITE_OTHER): Payer: 59

## 2021-08-29 DIAGNOSIS — J4550 Severe persistent asthma, uncomplicated: Secondary | ICD-10-CM | POA: Diagnosis not present

## 2021-09-26 ENCOUNTER — Ambulatory Visit: Payer: 59

## 2021-10-01 ENCOUNTER — Ambulatory Visit (INDEPENDENT_AMBULATORY_CARE_PROVIDER_SITE_OTHER): Payer: 59

## 2021-10-01 ENCOUNTER — Other Ambulatory Visit: Payer: Self-pay

## 2021-10-01 DIAGNOSIS — J4550 Severe persistent asthma, uncomplicated: Secondary | ICD-10-CM | POA: Diagnosis not present

## 2021-10-04 ENCOUNTER — Telehealth: Payer: Self-pay | Admitting: *Deleted

## 2021-10-04 NOTE — Telephone Encounter (Signed)
Tammy please give Alexander Hodge a call- he has concerns about his Tezspire injection costing too much money.

## 2021-10-08 NOTE — Telephone Encounter (Signed)
I l/m for patient that they were sending his balance to his copay card which should make it $0 and if he has additional questions to reach out to me directly

## 2021-10-13 ENCOUNTER — Other Ambulatory Visit: Payer: Self-pay | Admitting: Allergy and Immunology

## 2021-10-22 ENCOUNTER — Telehealth: Payer: Self-pay | Admitting: *Deleted

## 2021-10-22 NOTE — Telephone Encounter (Signed)
L/m for patient to contact me to if he has copay card info to give to pharmacy

## 2021-10-29 ENCOUNTER — Other Ambulatory Visit: Payer: Self-pay

## 2021-10-29 ENCOUNTER — Ambulatory Visit (INDEPENDENT_AMBULATORY_CARE_PROVIDER_SITE_OTHER): Payer: 59

## 2021-10-29 DIAGNOSIS — J4550 Severe persistent asthma, uncomplicated: Secondary | ICD-10-CM

## 2021-11-26 ENCOUNTER — Other Ambulatory Visit: Payer: Self-pay

## 2021-11-26 ENCOUNTER — Ambulatory Visit (INDEPENDENT_AMBULATORY_CARE_PROVIDER_SITE_OTHER): Payer: 59

## 2021-11-26 DIAGNOSIS — J4550 Severe persistent asthma, uncomplicated: Secondary | ICD-10-CM

## 2021-12-13 ENCOUNTER — Telehealth: Payer: Self-pay | Admitting: *Deleted

## 2021-12-13 NOTE — Telephone Encounter (Signed)
L/m for patient to contact clinic to make MD appt for Tezspire reapproval ?

## 2021-12-19 NOTE — Patient Instructions (Addendum)
Severe persistent asthma ?I will send a message to Tammy, our Biologics coordinator considering the message you are getting from [facility] about test for holding up your Dupixent injections because of a balance. ?Continue Tezspire injections every 4 weeks ?Continue Dulera 200 mcg 2 puffs twice a day with spacer to help prevent cough and wheeze ?Continue Spiriva Respimat 1.25 mcg 2 puffs once a day to help prevent cough and wheeze ?Continue montelukast 10 mg at night to help prevent cough and wheeze.  May use albuterol 2 puffs every 4-6 hours as needed for cough, wheeze, tightness in chest, or shortness of breath. ?Asthma control goals:  ?Full participation in all desired activities (may need albuterol before activity) ?Albuterol use two time or less a week on average (not counting use with activity) ?Cough interfering with sleep two time or less a month ?Oral steroids no more than once a year ?No hospitalizations ? ? ?Allergic rhinoconjunctivitis ?Start Pataday (olopatadine) 0.2% using 1 drop each eye once a day as needed for itchy watery eyes.  Samples given ?Continue cetirizine 10 mg once a day as needed for runny nose/itching.  May alternate between Xyzal (levocetirizine) and cetirizine.  Samples of Xyzal given ?Continue Ayr gel for nose as well as nasal saline rinses ? ?Atopic dermatitis ?Continue dupilumab injections and topical agents as per her dermatologist ? ?Anaphylactic shock due to food ?Avoid fish and shellfish. In case of an allergic reaction, give Benadryl 4 teaspoonfuls every 4 hours, and if life-threatening symptoms occur, inject with EpiPen 0.3 mg. ? ?Epistaxis/hereditary hemorrhagic telangiectasia ?Continue with ENT for this ?You may use saline nasal gel as needed ? ?Heartburn ?Start a 30 day trial of omeprazole 20 mg once a day 30 minutes before dinner ? ?Please let us know if this treatment plan is not working well for you ?Schedule a follow-up appointment in 4 weeks or sooner if needed ? ? ?

## 2021-12-20 ENCOUNTER — Encounter: Payer: Self-pay | Admitting: Family

## 2021-12-20 ENCOUNTER — Ambulatory Visit: Payer: 59 | Admitting: Family

## 2021-12-20 VITALS — BP 104/76 | HR 76 | Temp 98.1°F | Resp 16 | Ht 66.0 in | Wt 169.8 lb

## 2021-12-20 DIAGNOSIS — R040 Epistaxis: Secondary | ICD-10-CM

## 2021-12-20 DIAGNOSIS — J4550 Severe persistent asthma, uncomplicated: Secondary | ICD-10-CM | POA: Diagnosis not present

## 2021-12-20 DIAGNOSIS — J3089 Other allergic rhinitis: Secondary | ICD-10-CM

## 2021-12-20 DIAGNOSIS — I780 Hereditary hemorrhagic telangiectasia: Secondary | ICD-10-CM

## 2021-12-20 DIAGNOSIS — T7800XD Anaphylactic reaction due to unspecified food, subsequent encounter: Secondary | ICD-10-CM

## 2021-12-20 DIAGNOSIS — L2089 Other atopic dermatitis: Secondary | ICD-10-CM

## 2021-12-20 DIAGNOSIS — H1013 Acute atopic conjunctivitis, bilateral: Secondary | ICD-10-CM | POA: Diagnosis not present

## 2021-12-20 MED ORDER — OLOPATADINE HCL 0.2 % OP SOLN: OPHTHALMIC | 5 refills | Status: DC

## 2021-12-20 MED ORDER — EPINEPHRINE 0.3 MG/0.3ML IJ SOAJ: INTRAMUSCULAR | 1 refills | Status: DC

## 2021-12-20 MED ORDER — OMEPRAZOLE MAGNESIUM 20 MG PO TBEC: 20.0000 mg | DELAYED_RELEASE_TABLET | Freq: Every day | ORAL | 0 refills | Status: DC

## 2021-12-20 NOTE — Progress Notes (Signed)
? ?[place] [id number] ?Dept: [phone] ? ?FOLLOW UP NOTE ? ?Patient ID: Alexander Hodge, male    DOB: 12/26/1960  Age: 61 y.o. MRN: [id number] ?Date of Office Visit: 12/20/2021 ? ?Assessment  ?Chief Complaint: Asthma ? ?HPI ?Alexander Hodge is a 61 year old male who presents today for follow-up of severe persistent asthma, allergic rhinitis, atopic dermatitis, anaphylactic shock due to food, and epistaxis/hereditary hemorrhagic telangiectasia.  He was last seen on July 05, 2021 by Dr. Simona Huh.  Since his last office visit he denies any new diagnosis or surgeries.   ? ?Severe persistent asthma is reported as controlled with Dulera 200 mcg 2 puffs twice a day with a spacer, Spiriva Respimat 1.25 mcg 2 puffs once a day, montelukast 10 mg once a day, Tezspire injections every 4 weeks, and albuterol as needed.  His most recent Tezspire injection was on November 26, 2021 and denies any problems with these injections.  He denies cough, wheeze, tightness in chest, shortness of breath, and nocturnal awakenings due to breathing problems.  Since his last office visit he has not required any systemic steroids or made any trips to the emergency room or urgent care due to breathing problems.  He uses his albuterol inhaler maybe 6 times a month.  He does feel like his breathing is doing really well.  His ACT score today is a 20. ? ?Allergic rhinitis is reported as moderately controlled with cetirizine 10 mg once a day and sinus rinses as needed.  He reports itchy eyes, sneezing, and postnasal drip that just started today after being out with someone mowing the grass.  He denies rhinorrhea and nasal congestion.  He has not had any sinus infections since we last saw him.  He does mention if he sneezes hard enough he will wheeze. ? ?He continues to avoid fish and shellfish without any accidental ingestion or use of his epinephrine autoinjector device.  He is not sure if his epinephrine autoinjector device is  up-to-date. ? ?Atopic dermatitis is reported as controlled with daily moisturization and Dupixent injections every 2 weeks.  He reports his last Dupixent injection was last Thursday.  He continues to follow-up with his dermatologist.  He does have a question because he is received a phone call from [facility] stating that [facility] is holding up his Dupixent injections because of a balance.  He would like to speak with Tammy, our Biologics coordinator about this. ? ?Epistaxis/hereditary hemorrhagic telangiectasia is reported as doing better.  He does mention that he had a nosebleed last night in his sleep and this will occasionally happen.  He still follows up with his ENT, Dr. Laurance Flatten. ? ? ?Drug Allergies:  ?Allergies  ?Allergen Reactions  ? Eggs Or Egg-Derived Products Shortness Of Breath  ? Eicosapentaenoic Acid (Epa) Rash and Hives  ?  Unknown reaction ?Unknown reaction  ? Poultry Meal Shortness Of Breath  ? Fish Allergy Other (See Comments)  ?  Unknown reaction ?Unknown reaction ?Unknown reaction  ? Shellfish Allergy   ? Fish-Derived Products Rash  ?  Unknown reaction  ? ? ?Review of Systems: ?Review of Systems  ?Constitutional:  Negative for chills and fever.  ?HENT:    ?     Reports occasional episodes of epistaxis, postnasal drip that started today, and sneezing.  Denies rhinorrhea and nasal congestion  ?Eyes:   ?     Reports itchy eyes  ?Respiratory:  Negative for cough, shortness of breath and wheezing.   ?  Denies cough, wheeze, tightness in chest, shortness of breath, nocturnal awakenings due to breathing problems  ?Cardiovascular:  Negative for chest pain and palpitations.  ?Gastrointestinal:  Positive for heartburn.  ?     Reports heartburn symptoms daily for where he will take Tums or an occasional Alka-Seltzer  ?Genitourinary:  Positive for frequency.  ?Skin:  Negative for itching and rash.  ?Neurological:  Positive for headaches.  ?Endo/Heme/Allergies:  Positive for environmental allergies.   ? ? ?Physical Exam: ?BP 104/76 (BP Location: Right Arm, Patient Position: Sitting, Cuff Size: Normal)   Pulse 76   Temp 98.1 ?F (36.7 ?C) (Temporal)   Resp 16   Ht '5\' 6"'$  (1.676 m)   Wt 169 lb 12.8 oz (77 kg)   SpO2 99%   BMI 27.41 kg/m?   ? ?Physical Exam ?Constitutional:   ?   Appearance: Normal appearance.  ?HENT:  ?   Head: Normocephalic and atraumatic.  ?   Comments: Pharynx normal, eyes normal, ears normal, nose: Scabbing noted in bilateral nostrils. ?   Right Ear: Tympanic membrane, ear canal and external ear normal.  ?   Left Ear: Tympanic membrane, ear canal and external ear normal.  ?   Mouth/Throat:  ?   Mouth: Mucous membranes are moist.  ?   Pharynx: Oropharynx is clear.  ?Eyes:  ?   Conjunctiva/sclera: Conjunctivae normal.  ?Cardiovascular:  ?   Rate and Rhythm: Regular rhythm.  ?   Heart sounds: Normal heart sounds.  ?Pulmonary:  ?   Effort: Pulmonary effort is normal.  ?   Breath sounds: Normal breath sounds.  ?   Comments: Lungs clear to auscultation ?Musculoskeletal:  ?   Cervical back: Neck supple.  ?Skin: ?   General: Skin is warm.  ?Neurological:  ?   Mental Status: He is alert and oriented to person, place, and time.  ?Psychiatric:     ?   Mood and Affect: Mood normal.     ?   Behavior: Behavior normal.     ?   Thought Content: Thought content normal.     ?   Judgment: Judgment normal.  ? ? ?Diagnostics: ?FVC 3.32 L (98%), FEV1 1.83 L (69%).  Predicted FVC 3.38 L, predicted FEV1 2.68 L.  Spirometry indicates possible moderate obstruction. ? ?Assessment and Plan: ?1. Asthma, severe persistent, well-controlled   ?2. Other allergic rhinitis   ?3. Allergic conjunctivitis of both eyes   ?4. Atopic dermatitis   ?5. Anaphylactic shock due to food, subsequent encounter   ?6. Epistaxis   ?7. 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 ? ?No orders of the defined types were placed in this encounter. ? ? ?Patient Instructions  ?Severe persistent asthma ?I will send a message to Tammy, our  Biologics coordinator considering the message you are getting from [facility] about test for holding up your Dupixent injections because of a balance. ?Continue Tezspire injections every 4 weeks ?Continue Dulera 200 mcg 2 puffs twice a day with spacer to help prevent cough and wheeze ?Continue Spiriva Respimat 1.25 mcg 2 puffs once a day to help prevent cough and wheeze ?Continue montelukast 10 mg at night to help prevent cough and wheeze.  May use albuterol 2 puffs every 4-6 hours as needed for cough, wheeze, tightness in chest, or shortness of breath. ?Asthma control goals:  ?Full participation in all desired activities (may need albuterol before activity) ?Albuterol use two time or less a week on average (not counting use with activity) ?Cough  interfering with sleep two time or less a month ?Oral steroids no more than once a year ?No hospitalizations ? ? ?Allergic rhinoconjunctivitis ?Start Pataday (olopatadine) 0.2% using 1 drop each eye once a day as needed for itchy watery eyes.  Samples given ?Continue cetirizine 10 mg once a day as needed for runny nose/itching.  May alternate between Xyzal (levocetirizine) and cetirizine.  Samples of Xyzal given ?Continue Ayr gel for nose as well as nasal saline rinses ? ?Atopic dermatitis ?Continue dupilumab injections and topical agents as per her dermatologist ? ?Anaphylactic shock due to food ?Avoid fish and shellfish. In case of an allergic reaction, give Benadryl 4 teaspoonfuls every 4 hours, and if life-threatening symptoms occur, inject with EpiPen 0.3 mg. ? ?Epistaxis/hereditary hemorrhagic telangiectasia ?Continue with ENT for this ?You may use saline nasal gel as needed ? ?Heartburn ?Start a 30 day trial of omeprazole 20 mg once a day 30 minutes before dinner ? ?Please let us know if this treatment plan is not working well for you ?Schedule a follow-up appointment in 4 weeks or sooner if needed ? ? ? ?Return in about 4 weeks (around 01/17/2022), or if symptoms  worsen or fail to improve. ?  ? ?Thank you for the opportunity to care for this patient.  Please do not hesitate to contact me with questions. ? ?Althea Charon, FNP ?[facility] ? ? ? ? ?

## 2021-12-21 ENCOUNTER — Telehealth: Payer: Self-pay | Admitting: *Deleted

## 2021-12-21 NOTE — Telephone Encounter (Signed)
Thank you :)

## 2021-12-21 NOTE — Telephone Encounter (Signed)
I l/m for patient to reach out to my contact at [facility]. His Tezspire and Dupixent ready to go with $0 copay. It does show he has balance but I cannot talk to billing he has to. Advised him to reach out to my contact and she can get his ok to ship Tezpsire and get him in touch with billing so he can make sure the balance they are showing can be billed to his copay card ?

## 2021-12-21 NOTE — Telephone Encounter (Signed)
-----   Message from Althea Charon, [place] sent at 12/20/2021  3:26 PM EDT ----- ?Please contact patient he has concerns about Tezspire holding up his Dupixent delivery because of a balance. ?

## 2021-12-24 ENCOUNTER — Ambulatory Visit: Payer: 59

## 2022-01-17 ENCOUNTER — Other Ambulatory Visit: Payer: Self-pay | Admitting: Family

## 2022-01-17 ENCOUNTER — Ambulatory Visit: Payer: 59

## 2022-01-18 NOTE — Telephone Encounter (Signed)
Would you like to refill this? ?

## 2022-01-18 NOTE — Telephone Encounter (Signed)
Please call and see if omeprazole 20 mg once a day as helped relieve his heartburn symptoms.Send message back to me.

## 2022-01-21 NOTE — Telephone Encounter (Signed)
Ok thank you. We will see him tomorrow ?

## 2022-01-21 NOTE — Patient Instructions (Addendum)
Severe persistent asthma ?Continue Tezspire injections every 4 weeks ?Continue Dulera 200 mcg 2 puffs twice a day with spacer to help prevent cough and wheeze ?Continue Spiriva Respimat 1.25 mcg 2 puffs once a day to help prevent cough and wheeze ?Continue montelukast 10 mg at night to help prevent cough and wheeze.  May use albuterol 2 puffs every 4-6 hours as needed for cough, wheeze, tightness in chest, or shortness of breath. ?Asthma control goals:  ?Full participation in all desired activities (may need albuterol before activity) ?Albuterol use two time or less a week on average (not counting use with activity) ?Cough interfering with sleep two time or less a month ?Oral steroids no more than once a year ?No hospitalizations ? ? ?Allergic rhinoconjunctivitis ?Continue Pataday (olopatadine) 0.2% using 1 drop each eye once a day as needed for itchy watery eyes.   ?Continue Xyzal (levocetirizine) 5 mg once a day as needed for runny nose/itching.  Try taking Xyzal more consistently while her allergies are worse ?Continue Ayr gel for nose as well as nasal saline rinses ? ?Atopic dermatitis ?Continue dupilumab injections and topical agents as per your dermatologist ? ?Anaphylactic shock due to food ?Avoid fish and shellfish. In case of an allergic reaction, give Benadryl 4 teaspoonfuls every 4 hours, and if life-threatening symptoms occur, inject with EpiPen 0.3 mg. ? ?Epistaxis/hereditary hemorrhagic telangiectasia ?Continue to follow up with your ENT for this ?You may use saline nasal gel as needed ? ?Heartburn ?Stop omeprazole 20 mg and start Pepcid (famotidine) 20 mg once a day.  You continue to have symptoms we will refer you to GI ? ?Please let us know if this treatment plan is not working well for you ?Schedule a follow-up appointment in 2-3 months or sooner if needed ? ? ?

## 2022-01-22 ENCOUNTER — Encounter: Payer: Self-pay | Admitting: Family

## 2022-01-22 ENCOUNTER — Ambulatory Visit: Payer: 59 | Admitting: Family

## 2022-01-22 VITALS — BP 122/78 | Temp 99.0°F | Resp 22

## 2022-01-22 DIAGNOSIS — J3089 Other allergic rhinitis: Secondary | ICD-10-CM | POA: Diagnosis not present

## 2022-01-22 DIAGNOSIS — H1013 Acute atopic conjunctivitis, bilateral: Secondary | ICD-10-CM | POA: Diagnosis not present

## 2022-01-22 DIAGNOSIS — T7800XD Anaphylactic reaction due to unspecified food, subsequent encounter: Secondary | ICD-10-CM

## 2022-01-22 DIAGNOSIS — R040 Epistaxis: Secondary | ICD-10-CM

## 2022-01-22 DIAGNOSIS — I780 Hereditary hemorrhagic telangiectasia: Secondary | ICD-10-CM

## 2022-01-22 DIAGNOSIS — L2089 Other atopic dermatitis: Secondary | ICD-10-CM

## 2022-01-22 DIAGNOSIS — J4550 Severe persistent asthma, uncomplicated: Secondary | ICD-10-CM | POA: Diagnosis not present

## 2022-01-22 MED ORDER — FAMOTIDINE 20 MG PO TABS: 20.0000 mg | ORAL_TABLET | Freq: Every day | ORAL | 2 refills | Status: DC

## 2022-01-22 NOTE — Progress Notes (Signed)
? ?[place] [address] ?Dept: [phone] ? ?FOLLOW UP NOTE ? ?Patient ID: Alexander Hodge, male    DOB: 03-29-1961  Age: 61 y.o. MRN: [id number] ?Date of Office Visit: 01/22/2022 ? ?Assessment  ?Chief Complaint: Follow-up (Pt is present due to follow up of his asthma stating its been okay. Pt has been wheezing , sneezing and coughing.) ? ?HPI ?Alexander Hodge is a 61 year old male who presents today for follow-up of severe persistent asthma, allergic rhinitis, allergic conjunctivitis, atopic dermatitis, anaphylactic shock due to food, epistaxis, and hereditary hemorrhagic telangiectasia.  He was last seen on December 20, 2021 by myself.  Since his last office visit he denies any new diagnosis or surgeries. ? ?Severe persistent asthma is reported as moderately controlled with Dulera 200 mcg 2 puffs twice a day with spacer, Spiriva Respimat 1.25 mcg 2 puffs once a day, montelukast 10 mg at night, albuterol as needed and Tezspire injections every 4 weeks.    He mentions that he was due for his Tezspire injection last week but it was not delivered to our office. He reports that he has been working with Alexander Hodge, our Systems analyst ,and he gave the company that sends his [facility] the information they needed to be updated so hopefully he will be able to get his Tezspire soon. He feels like his breathing could be better if he got his Tezspire injections more consistently.  He denies any reactions or problems with Tezspire injections.  He reports yesterday he noticed that he would sneeze a couple times and then wheeze.  He also coughed earlier today and coughed up thick clear mucus.  He denies fever, tightness in chest, shortness of breath, and nocturnal awakenings due to breathing problems.  Since his last office visit he has not required any systemic steroids or made any trips to the emergency room or urgent care due to breathing problems.  He uses his albuterol inhaler once or twice a week. ? ?Allergic  rhinoconjunctivitis is reported as moderately controlled with Xyzal 5 mg every other day, Pataday eyedrops as needed, and Ayr gel as needed.  He reports sneezing a couple times and postnasal drip.  He also reports itchy watery eyes for which Pataday eyedrops help.  He feels that maybe he should increase his Xyzal to every day during pollen season.  He reports his allergies tend to be worse in the spring and the fall.  He reports that the Ayr gel really helps with the nosebleeds/hereditary hemorrhagic angiectasia.  He has not recently seen his ear nose and throat doctor. ? ?Atopic dermatitis is reported as controlled with dupilumab injections that he gets at his dermatologist office.  He reports that he is due this Thursday for this injection. ? ?Heartburn is reported as very helpful with omeprazole 20 mg once a day.  He reports that he had heartburn symptoms only once or twice since starting on omeprazole 20 mg once a day. ? ?Drug Allergies:  ?Allergies  ?Allergen Reactions  ? Eggs Or Egg-Derived Products Shortness Of Breath  ? Eicosapentaenoic Acid (Epa) Rash and Hives  ?  Unknown reaction ?Unknown reaction  ? Poultry Meal Shortness Of Breath  ? Fish Allergy Other (See Comments)  ?  Unknown reaction ?Unknown reaction ?Unknown reaction  ? Shellfish Allergy   ? Fish-Derived Products Rash  ?  Unknown reaction  ? ? ?Review of Systems: ?Review of Systems  ?Constitutional:  Negative for chills and fever.  ?HENT:    ?  Reports sneezing at times and denies rhinorrhea and nasal congestion  ?Eyes:   ?     Reports a little bit of itchy watery eyes for which Pataday eyedrops help  ?Respiratory:  Positive for cough, sputum production and wheezing. Negative for shortness of breath.   ?     Reports wheezing a few times and a productive cough earlier today with thick clear mucus.  Denies tightness in chest, shortness of breath, and nocturnal awakenings due to breathing problems  ?Cardiovascular:  Negative for chest pain and  palpitations.  ?Gastrointestinal:  Positive for heartburn.  ?     Reports heartburn symptoms 1 or 2 times since starting omeprazole 20 mg once a day  ?Genitourinary:  Positive for frequency.  ?     Reports increased frequency of urination and feeling like he is not emptying his bladder.  Reports he is spoken with his primary care physician about this and that his prostate is normal.  ?Skin:  Negative for itching and rash.  ?Neurological:  Negative for headaches.  ?Endo/Heme/Allergies:  Positive for environmental allergies.  ? ? ?Physical Exam: ?BP 122/78   Temp 99 ?F (37.2 ?C) (Temporal)   Resp (!) 22   ? ?Physical Exam ?Constitutional:   ?   Appearance: Normal appearance.  ?HENT:  ?   Head: Normocephalic and atraumatic.  ?   Comments: Pharynx normal, eyes normal, ears: Unable to see right tympanic membrane due to cerumen.  Left ear normal.  Nose: Scabbing noted in bilateral nostrils ?   Right Ear: Ear canal and external ear normal.  ?   Left Ear: Tympanic membrane, ear canal and external ear normal.  ?   Mouth/Throat:  ?   Mouth: Mucous membranes are moist.  ?   Pharynx: Oropharynx is clear.  ?Eyes:  ?   Conjunctiva/sclera: Conjunctivae normal.  ?Cardiovascular:  ?   Rate and Rhythm: Regular rhythm.  ?   Heart sounds: Normal heart sounds.  ?Pulmonary:  ?   Effort: Pulmonary effort is normal.  ?   Breath sounds: Normal breath sounds.  ?   Comments: Lungs clear to auscultation ?Musculoskeletal:  ?   Cervical back: Neck supple.  ?Skin: ?   General: Skin is warm.  ?Neurological:  ?   Mental Status: He is alert and oriented to person, place, and time.  ?Psychiatric:     ?   Mood and Affect: Mood normal.     ?   Behavior: Behavior normal.     ?   Thought Content: Thought content normal.     ?   Judgment: Judgment normal.  ? ? ?Diagnostics: ?FVC 2.37 L (70%), FEV1 1.36 L (51%).  Predicted FVC 3.38 L, predicted FEV1 2.6 L.  Spirometry indicates moderately severe obstruction and restriction. ? ?Assessment and Plan: ?1.  Asthma, severe persistent, well-controlled   ?2. Other allergic rhinitis   ?3. Allergic conjunctivitis of both eyes   ?4. Atopic dermatitis   ?5. Anaphylactic shock due to food, subsequent encounter   ?6. 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 Epistaxis   ? ? ?Meds ordered this encounter  ?Medications  ? famotidine (PEPCID) 20 MG tablet  ?  Sig: Take 1 tablet (20 mg total) by mouth daily.  ?  Dispense:  30 tablet  ?  Refill:  2  ? ? ?Patient Instructions  ?Severe persistent asthma ?Continue Tezspire injections every 4 weeks ?Continue Dulera 200 mcg 2 puffs twice a day with spacer to help prevent cough and  wheeze ?Continue Spiriva Respimat 1.25 mcg 2 puffs once a day to help prevent cough and wheeze ?Continue montelukast 10 mg at night to help prevent cough and wheeze.  May use albuterol 2 puffs every 4-6 hours as needed for cough, wheeze, tightness in chest, or shortness of breath. ?Asthma control goals:  ?Full participation in all desired activities (may need albuterol before activity) ?Albuterol use two time or less a week on average (not counting use with activity) ?Cough interfering with sleep two time or less a month ?Oral steroids no more than once a year ?No hospitalizations ? ? ?Allergic rhinoconjunctivitis ?Continue Pataday (olopatadine) 0.2% using 1 drop each eye once a day as needed for itchy watery eyes.   ?Continue Xyzal (levocetirizine) 5 mg once a day as needed for runny nose/itching.  Try taking Xyzal more consistently while her allergies are worse ?Continue Ayr gel for nose as well as nasal saline rinses ? ?Atopic dermatitis ?Continue dupilumab injections and topical agents as per your dermatologist ? ?Anaphylactic shock due to food ?Avoid fish and shellfish. In case of an allergic reaction, give Benadryl 4 teaspoonfuls every 4 hours, and if life-threatening symptoms occur, inject with EpiPen 0.3 mg. ? ?Epistaxis/hereditary hemorrhagic telangiectasia ?Continue to follow up with your ENT  for this ?You may use saline nasal gel as needed ? ?Heartburn ?Stop omeprazole 20 mg and start Pepcid (famotidine) 20 mg once a day.  You continue to have symptoms we will refer you to GI ? ?Please let us know

## 2022-01-31 ENCOUNTER — Other Ambulatory Visit: Payer: Self-pay | Admitting: Allergy and Immunology

## 2022-02-12 ENCOUNTER — Telehealth: Payer: Self-pay | Admitting: *Deleted

## 2022-02-12 NOTE — Telephone Encounter (Signed)
Patient pharmacy coverage will not cover Tezspire- excluded benefit. We had gotten approval from [place] in past for therapy but they now advise denial for same since he is on Dupixent also, event hough not for same disease state. Will send out denial with appeal info shortly.

## 2022-02-13 NOTE — Telephone Encounter (Signed)
Thank you! I think his asthma was doing much better on this medication.

## 2022-02-20 ENCOUNTER — Telehealth: Payer: Self-pay | Admitting: *Deleted

## 2022-02-20 NOTE — Telephone Encounter (Signed)
Patient called reference bill for his Tezspire. I had addressed same a couple months back but not sure whether he called or they didn't have copay card. Since I am unable to speak to billing on his behalf I gave him copay card info and instructed to call [facility] billing and provide same.  I also advised that [facility] will not pay for Dupixent and Tezspire and have denied to [facility] and will be sending out letter with next steps and if he get same let us know

## 2022-02-21 NOTE — Telephone Encounter (Signed)
Spoke to patient and advised waiting on letter from [facility] for next steps to appeal Tezspire approval. He will reach out if he receives letter first

## 2022-04-26 ENCOUNTER — Ambulatory Visit: Payer: 59 | Admitting: Family

## 2022-04-29 NOTE — Patient Instructions (Incomplete)
Severe persistent asthma-not well controlled Discussed the options with his asthma.  For now we will not be able to get Tezspire injections every 4 weeks due to insurance not covering due to being on [place].  His last Tezspire injection was on November 26, 2021.  Please let us know if you would like to stop Dupixent injections and restart Tezspire.  Another option is to stay on your current medications since your spirometry is actually better than what it was at your last office visit.  The last option is to do a trial of low-dose prednisone 5 mg once a day for 2 weeks and do repeat spirometry to see how your spirometry looks.  Discussed the side effects of long-term steroid use.  See below.  He would like to discuss these options with his wife first. Continue Dulera 200 mcg 2 puffs twice a day with spacer to help prevent cough and wheeze Continue Spiriva Respimat 1.25 mcg 2 puffs once a day to help prevent cough and wheeze Continue montelukast 10 mg at night to help prevent cough and wheeze.  May use albuterol 2 puffs every 4-6 hours as needed for cough, wheeze, tightness in chest, or shortness of breath. Asthma control goals:  Full participation in all desired activities (may need albuterol before activity) Albuterol use two time or less a week on average (not counting use with activity) Cough interfering with sleep two time or less a month Oral steroids no more than once a year No hospitalizations   Allergic rhinoconjunctivitis Continue Pataday (olopatadine) 0.2% using 1 drop each eye once a day as needed for itchy watery eyes.   Continue Xyzal (levocetirizine) 5 mg once a day as needed for runny nose/itching.  Try taking Xyzal more consistently while her allergies are worse Continue Ayr gel for nose as well as nasal saline rinses  Atopic dermatitis Continue dupilumab injections and topical agents as per your dermatologist.  There are case reports showing that patient's eczema does not get worse  after stopping Dupixent.  Anaphylactic shock due to food Avoid fish and shellfish. In case of an allergic reaction, give Benadryl 4 teaspoonfuls every 4 hours, and if life-threatening symptoms occur, inject with EpiPen 0.3 mg.  Epistaxis/hereditary hemorrhagic telangiectasia Continue to follow up with your ENT for this You may use saline nasal gel as needed  Heartburn Continue Pepcid (famotidine) 20 mg once a day.    Please let us know if this treatment plan is not working well for you Schedule a follow-up appointment in 2 weeks with Dr. Simona Huh or sooner if needed  Complications of Chronic Steroid Use

## 2022-04-30 ENCOUNTER — Encounter: Payer: Self-pay | Admitting: Family

## 2022-04-30 ENCOUNTER — Ambulatory Visit: Payer: 59 | Admitting: Family

## 2022-04-30 VITALS — BP 98/70 | HR 86 | Temp 98.2°F | Resp 16

## 2022-04-30 DIAGNOSIS — J4550 Severe persistent asthma, uncomplicated: Secondary | ICD-10-CM | POA: Diagnosis not present

## 2022-04-30 DIAGNOSIS — L2089 Other atopic dermatitis: Secondary | ICD-10-CM | POA: Diagnosis not present

## 2022-04-30 DIAGNOSIS — H1013 Acute atopic conjunctivitis, bilateral: Secondary | ICD-10-CM

## 2022-04-30 DIAGNOSIS — J3089 Other allergic rhinitis: Secondary | ICD-10-CM

## 2022-04-30 DIAGNOSIS — T7800XD Anaphylactic reaction due to unspecified food, subsequent encounter: Secondary | ICD-10-CM

## 2022-04-30 DIAGNOSIS — R040 Epistaxis: Secondary | ICD-10-CM

## 2022-04-30 DIAGNOSIS — I780 Hereditary hemorrhagic telangiectasia: Secondary | ICD-10-CM

## 2022-04-30 MED ORDER — SPIRIVA RESPIMAT 1.25 MCG/ACT IN AERS: 2.0000 | INHALATION_SPRAY | Freq: Every day | RESPIRATORY_TRACT | 1 refills | Status: DC

## 2022-04-30 MED ORDER — FAMOTIDINE 20 MG PO TABS: 20.0000 mg | ORAL_TABLET | Freq: Every day | ORAL | 1 refills | Status: DC

## 2022-04-30 MED ORDER — ALBUTEROL SULFATE HFA 108 (90 BASE) MCG/ACT IN AERS: INHALATION_SPRAY | RESPIRATORY_TRACT | 1 refills | Status: DC

## 2022-04-30 MED ORDER — DULERA 200-5 MCG/ACT IN AERO: INHALATION_SPRAY | RESPIRATORY_TRACT | 1 refills | Status: DC

## 2022-04-30 MED ORDER — MONTELUKAST SODIUM 10 MG PO TABS: 10.0000 mg | ORAL_TABLET | Freq: Every day | ORAL | 1 refills | Status: DC

## 2022-04-30 MED ORDER — EPINEPHRINE 0.3 MG/0.3ML IJ SOAJ: INTRAMUSCULAR | 1 refills | Status: DC

## 2022-04-30 NOTE — Progress Notes (Signed)
[place] [address] Dept: [phone]  FOLLOW UP NOTE  Patient ID: Alexander Hodge, male    DOB: 20-Jan-1961  Age: 61 y.o. MRN: [id number] Date of Office Visit: 04/30/2022  Assessment  Chief Complaint: Asthma  HPI Alexander Hodge is a 61 year old male who presents today for follow-up of severe persistent asthma, allergic rhinitis, allergic conjunctivitis, atopic dermatitis, anaphylactic shock due to food, hereditary hemorrhagic telangiectasia, and epistaxis.  He was last seen on Jan 22, 2022 by myself.  He denies any new diagnosis or surgery since his last office visit.  Severe persistent asthma is reported as not well controlled with Dulera 200 mcg 2 puffs twice a day with spacer, Spiriva Respimat 1.25 mcg 2 puffs once a day, montelukast 10 mg at night, and albuterol as needed.  He reports a productive cough with thick clear mucus, wheezing, shortness of breath, and nocturnal awakenings due to breathing problems.  He denies tightness in his chest and fever.  He does mention yesterday he was cold.  He reports that his breathing was better while on Tezspire.  Since his last office visit he has not required any trips to the emergency room or urgent care due to breathing problems and he has not required any systemic steroids.  He has been using his albuterol inhaler approximately 3 times a day and it helps some.  Discussed how after speaking with our Biologics coordinator that his insurance was not going to pay for him to be on 2 biologic drugs even though one of them was for his eczema and the other for his asthma. His breathing on spirometry actually looks better today than at his last visit. Discussed options that we could do for his asthma such as staying on his current regimen, or starting a low-dose of prednisone 5 mg once a day for 2 weeks to see how his spirometry looks, or consider stopping Dupixent and restarting Tezspire.  His last Tezspire injection was on November 26, 2021.   Discussed the possible side effects of chronic steroid use.  He would like to discuss these options with his wife.  Allergic rhinoconjunctivitis is reported as not well-controlled with Xyzal 5 mg once a day, Ayr gel, and olopatadine eyedrops.  He reports rhinorrhea that he is not sure the color of, nasal congestion, and occasional postnasal drip.  He thinks that he may of had a sinus infection, but did not take an antibiotic.  He reports that his allergies always start to flare first then it goes down into his chest.  Atopic dermatitis: He continues to take Dupixent injections as per dermatology.  He reports prior to being on Dupixent that his eczema was all over his body.  He has concerns about stopping the Dupixent.  He has an appointment with his dermatologist next month, but is going to try to get in touch sooner.  He continues to avoid fish and shellfish without any accidental ingestion or use of his epinephrine autoinjector device.  Epistaxis/hereditary hemorrhagic telangiectasia.  He continues to use saline gel as needed.  He reports that he has not seen his ENT, Dr. Laurance Flatten, in a while.  Heartburn: He continues to take Pepcid 20 mg once a day.  He reports occasional heartburn.  He reports that his heartburn has not been worse since stopping omeprazole.   Drug Allergies:  Allergies  Allergen Reactions   Eggs Or Egg-Derived Products Shortness Of Breath   Eicosapentaenoic Acid (Epa) Rash and Hives  Unknown reaction Unknown reaction   Poultry Meal Shortness Of Breath   Shellfish Allergy Shortness Of Breath   Fish Allergy Other (See Comments)    Unknown reaction Unknown reaction Unknown reaction   Fish-Derived Products Rash    Unknown reaction    Review of Systems: Review of Systems  Constitutional:  Negative for chills and fever.  HENT:         Reports rhinorrhea that he is not sure of the color also reports nasal congestion and postnasal drip at times  Eyes:        Denies  itchy watery eyes  Respiratory:         Reports a productive cough with thick clear mucus, wheezing, tightness in his chest, shortness of breath and nocturnal awakenings due to breathing problems  Cardiovascular:  Negative for chest pain and palpitations.  Gastrointestinal:  Positive for heartburn.       Reports occasional heartburn that is no worse since being on famotidine and stopping omeprazole  Genitourinary:  Positive for frequency.       Reports increased frequency of urination has been going on for a while.  He is seeing his primary care physician and reports that his prostate is normal  Skin:  Negative for itching and rash.  Neurological:  Positive for headaches.  Endo/Heme/Allergies:  Positive for environmental allergies.     Physical Exam: BP 98/70 (BP Location: Right Arm, Patient Position: Sitting, Cuff Size: Normal)   Pulse 86   Temp 98.2 F (36.8 C) (Temporal)   Resp 16   SpO2 100%    Physical Exam Constitutional:      Appearance: Normal appearance.  HENT:     Head: Normocephalic and atraumatic.     Comments: Pharynx normal, eyes normal, ears normal, nose: Blood and scabbing noted in bilateral nostrils    Right Ear: Tympanic membrane, ear canal and external ear normal.     Left Ear: Tympanic membrane, ear canal and external ear normal.     Mouth/Throat:     Mouth: Mucous membranes are moist.     Pharynx: Oropharynx is clear.  Eyes:     Conjunctiva/sclera: Conjunctivae normal.  Cardiovascular:     Rate and Rhythm: Normal rate and regular rhythm.     Heart sounds: Normal heart sounds.  Pulmonary:     Effort: Pulmonary effort is normal.     Breath sounds: Normal breath sounds.     Comments: Lungs clear to auscultation Musculoskeletal:     Cervical back: Neck supple.  Skin:    General: Skin is warm.     Comments: Hyperpigmented skin noted on bilateral lower legs  Neurological:     Mental Status: He is alert and oriented to person, place, and time.   Psychiatric:        Mood and Affect: Mood normal.        Behavior: Behavior normal.        Thought Content: Thought content normal.        Judgment: Judgment normal.     Diagnostics: FVC 2.62 L (78%), FEV1 1.70 L (64%).  Predicted FVC 3.37 L, predicted FEV1 2.65 L.  Spirometry indicates possible moderate obstruction.  Postbronchodilator response shows FVC 3.02 L (90%), FEV1 1.71 L (65%).  There is a 1% change in FEV1.  Spirometry indicates possible moderate obstruction.  Spirometry is improved from previous spirometry  Assessment and Plan: 1. Not well controlled severe persistent asthma   2. Other allergic rhinitis   3. Anaphylactic  shock due to food, subsequent encounter   4. Allergic conjunctivitis of both eyes   5. Atopic dermatitis   6. Hereditary hemorrhagic telangiectasia (HCC)   7. Epistaxis     Meds ordered this encounter  Medications   EPINEPHrine 0.3 mg/0.3 mL IJ SOAJ injection    Sig: Use as directed for severe allergic reaction    Dispense:  2 each    Refill:  1    Dispense mylan generic or brand name whichever is cheaper for the pt. Do not fill the adrenaclick. Please keep rx on hold. Pt. Will call when needed.   albuterol (VENTOLIN HFA) 108 (90 Base) MCG/ACT inhaler    Sig: INHALE 2 PUFFS BY MOUTH EVERY 4 HOURS AS NEEDED FOR WHEEZE OR FOR SHORTNESS OF BREATH    Dispense:  8.5 each    Refill:  1   famotidine (PEPCID) 20 MG tablet    Sig: Take 1 tablet (20 mg total) by mouth daily.    Dispense:  90 tablet    Refill:  1    Please dispense 90 day supply.   mometasone-formoterol (DULERA) 200-5 MCG/ACT AERO    Sig: 2 puffs every 12 hours to prevent coughing or wheezing.    Dispense:  3 each    Refill:  1    Please dispense 90 day supply.   montelukast (SINGULAIR) 10 MG tablet    Sig: Take 1 tablet (10 mg total) by mouth at bedtime.    Dispense:  90 tablet    Refill:  1    Dispense 90 day supply   Tiotropium Bromide Monohydrate (SPIRIVA RESPIMAT) 1.25 MCG/ACT  AERS    Sig: Inhale 2 puffs into the lungs daily.    Dispense:  12 g    Refill:  1    Please dispense 90 day supply    Patient Instructions  Severe persistent asthma-not well controlled Discussed the options with his asthma.  For now we will not be able to get Tezspire injections every 4 weeks due to insurance not covering due to being on [place].  His last Tezspire injection was on November 26, 2021.  Please let us know if you would like to stop Dupixent injections and restart Tezspire.  Another option is to stay on your current medications since your spirometry is actually better than what it was at your last office visit.  The last option is to do a trial of low-dose prednisone 5 mg once a day for 2 weeks and do repeat spirometry to see how your spirometry looks.  Discussed the side effects of long-term steroid use.  See below.  He would like to discuss these options with his wife first. Continue Dulera 200 mcg 2 puffs twice a day with spacer to help prevent cough and wheeze Continue Spiriva Respimat 1.25 mcg 2 puffs once a day to help prevent cough and wheeze Continue montelukast 10 mg at night to help prevent cough and wheeze.  May use albuterol 2 puffs every 4-6 hours as needed for cough, wheeze, tightness in chest, or shortness of breath. Asthma control goals:  Full participation in all desired activities (may need albuterol before activity) Albuterol use two time or less a week on average (not counting use with activity) Cough interfering with sleep two time or less a month Oral steroids no more than once a year No hospitalizations   Allergic rhinoconjunctivitis Continue Pataday (olopatadine) 0.2% using 1 drop each eye once a day as needed for itchy watery eyes.  Continue Xyzal (levocetirizine) 5 mg once a day as needed for runny nose/itching.  Try taking Xyzal more consistently while her allergies are worse Continue Ayr gel for nose as well as nasal saline rinses  Atopic  dermatitis Continue dupilumab injections and topical agents as per your dermatologist.  There are case reports showing that patient's eczema does not get worse after stopping Dupixent.  Anaphylactic shock due to food Avoid fish and shellfish. In case of an allergic reaction, give Benadryl 4 teaspoonfuls every 4 hours, and if life-threatening symptoms occur, inject with EpiPen 0.3 mg.  Epistaxis/hereditary hemorrhagic telangiectasia Continue to follow up with your ENT for this You may use saline nasal gel as needed  Heartburn Continue Pepcid (famotidine) 20 mg once a day.    Please let us know if this treatment plan is not working well for you Schedule a follow-up appointment in 2 weeks with Dr. Simona Huh or sooner if needed  Complications of Chronic Steroid Use     Return in about 2 weeks (around 05/14/2022), or if symptoms worsen or fail to improve.    Thank you for the opportunity to care for this patient.  Please do not hesitate to contact me with questions.  Althea Charon, FNP [facility]

## 2022-05-12 ENCOUNTER — Other Ambulatory Visit: Payer: Self-pay | Admitting: Family

## 2022-05-12 NOTE — Progress Notes (Unsigned)
FOLLOW UP Date of Service/Encounter:  05/13/22   Subjective:  Alexander Hodge (DOB: 10-29-1960) is a 61 y.o. male who returns to the [facility] on 05/13/2022 in re-evaluation of the following: severe persistent asthma, allergic rhinitis, allergic conjunctivitis, atopic dermatitis, anaphylactic shock due to food, hereditary hemorrhagic telangiectasia, and epistaxis History obtained from: chart review and patient.  For Review, LV was on 04/30/22  with Althea Charon, FNP seen for routine follow-up. Asthma reported as not controlled.  Doing better on Tezspire, but insurance would not cover both because he also takes Dupixent for eczema.   FEV1 1.70 L (64%); moderate obstruction seen on spirometry.  Today presents for follow-up. Asthma: started wheezing recently after smelling smoke which is a big trigger for him, using rescue inhaler once or twice a week when he feels his breathing is overbearing. Allergic rhinitis: having a lot of mucus and secretion in the past few weeks. He decided to increase his montelukast.  Using montelukast twice daily since last visit because of his increased mucus. He has not tried mucinex lately.  He did take cough medicine last night.  This helped him sleep some.  He does have an upcoming appointment in September with his dermatologist. Does have a new rash on his chest.  He does not feel he will ever be able to come off of [place] for his eczema because his eczema is also very severe, and he still has really dry skin on his legs and occasional flares of his rash. He does feel his asthma is much better than in the past, but he still struggles to breath. He would be willing to take a daily prednisone since we are unable to get his test prior approved.   Allergies as of 05/13/2022       Reactions   Eggs Or Egg-derived Products Shortness Of Breath   Eicosapentaenoic Acid (epa) Rash, Hives   Unknown reaction Unknown reaction   Poultry Meal Shortness  Of Breath   Shellfish Allergy Shortness Of Breath   Fish Allergy Other (See Comments)   Unknown reaction Unknown reaction Unknown reaction   Fish-derived Products Rash   Unknown reaction        Medication List        Accurate as of May 13, 2022  1:08 PM. If you have any questions, ask your nurse or doctor.          acetaminophen 500 MG tablet Commonly known as: TYLENOL Take by mouth.   albuterol 108 (90 Base) MCG/ACT inhaler Commonly known as: VENTOLIN HFA INHALE 2 PUFFS BY MOUTH EVERY 4 HOURS AS NEEDED FOR WHEEZE OR FOR SHORTNESS OF BREATH   alclomethasone 0.05 % ointment Commonly known as: ACLOVATE 1 application 2 times daily.   alendronate 70 MG tablet Commonly known as: FOSAMAX Take 70 mg by mouth once a week.   aminocaproic acid 25 % solution Commonly known as: AMICAR   ammonium lactate 12 % lotion Commonly known as: LAC-HYDRIN 1(ONE) APPLICATION(S) TOPICAL 2(TWO) TIMES A DAY   atorvastatin 40 MG tablet Commonly known as: LIPITOR Take 40 mg by mouth daily.   buPROPion 150 MG 24 hr tablet Commonly known as: WELLBUTRIN XL Take 1 tablet by mouth daily.   clobetasol cream 0.05 % Commonly known as: TEMOVATE Apply topically.   cyclobenzaprine 5 MG tablet Commonly known as: FLEXERIL Take 10 mg by mouth at bedtime.   desonide 0.05 % ointment Commonly known as: DESOWEN Apply topically.   diclofenac Sodium 1 %  Gel Commonly known as: VOLTAREN Apply small amount sparingly to the right posterior heel and achilles tendon   diphenhydramine-acetaminophen 25-500 MG Tabs tablet Commonly known as: TYLENOL PM Take by mouth.   Dulera 200-5 MCG/ACT Aero Generic drug: mometasone-formoterol 2 puffs every 12 hours to prevent coughing or wheezing.   DULoxetine 60 MG capsule Commonly known as: CYMBALTA TAKE 1 CAPSULE BY MOUTH ONCE DAILY WITH FOOD   dupilumab 300 MG/2ML prefilled syringe Commonly known as: DUPIXENT Inject into the skin.   EPINEPHrine  0.3 mg/0.3 mL Soaj injection Commonly known as: EPI-PEN Use as directed for severe allergic reaction   ergocalciferol 1.25 MG (50000 UT) capsule Commonly known as: VITAMIN D2 TAKE 1 CAPSULE BY MOUTH ONE TIME PER WEEK   erythromycin ophthalmic ointment SMARTSIG:In Eye(s) Every Night PRN   famotidine 20 MG tablet Commonly known as: PEPCID Take 1 tablet (20 mg total) by mouth daily.   fluticasone 0.05 % cream Commonly known as: CUTIVATE   levocetirizine 5 MG tablet Commonly known as: XYZAL Take 1 tablet by mouth every evening.   lidocaine 5 % Commonly known as: [place] onto the skin.   montelukast 10 MG tablet Commonly known as: SINGULAIR Take 1 tablet (10 mg total) by mouth at bedtime.   mupirocin ointment 2 % Commonly known as: BACTROBAN Apply to affected areas inside nose 3 times daily for 14 days   Olopatadine HCl 0.2 % Soln Place 1 drop in each eye once a day as needed for itchy watery eyes   pimecrolimus 1 % cream Commonly known as: ELIDEL   pregabalin 100 MG capsule Commonly known as: LYRICA Take 100 mg by mouth 3 (three) times daily.   pregabalin 50 MG capsule Commonly known as: LYRICA Take 50 mg (1 capsule) in the morning, 50 mg ( 1 capsule)  in the evening, and 100 mg ( 2 capsules)  at bedtime   saline Gel APPLY TO NOSE ONCE OR TWICE A DAY FOR NASAL IRRITATION.   Sarna Sensitive 1 % Lotn Generic drug: pramoxine 1(ONE) APPLICATION(S) TOPICAL EVERY DAY   Spiriva Respimat 1.25 MCG/ACT Aers Generic drug: Tiotropium Bromide Monohydrate Inhale 2 puffs into the lungs daily.   tacrolimus 0.1 % ointment Commonly known as: PROTOPIC Apply 1 application topically 2 (two) times daily as needed.   tadalafil 5 MG tablet Commonly known as: CIALIS TAKE 1 TABLET BY MOUTH  DAILY AS NEEDED FOR UP TO  30 DAYS FOR ERECTILE  DYSFUNCTION.   tamoxifen 20 MG tablet Commonly known as: NOLVADEX Take 20 mg by mouth.   tamsulosin 0.4 MG Caps capsule Commonly  known as: FLOMAX   triamcinolone cream 0.1 % Commonly known as: KENALOG 1 application as needed.       Past Medical History:  Diagnosis Date   Asthma    Past Surgical History:  Procedure Laterality Date   MASS EXCISION  02/21/2012   Procedure: MINOR EXCISION OF MASS;  Surgeon: Myrtha Mantis., MD;  Location: [facility];  Service: Ophthalmology;;        cyst removal  left eye both lids   portacath removal  07/2017   infection   Otherwise, there have been no changes to his past medical history, surgical history, family history, or social history.  ROS: All others negative except as noted per HPI.   Objective:  BP 110/78 (BP Location: Left Arm, Patient Position: Sitting, Cuff Size: Normal)   Pulse 87   Temp 98.2 F (36.8 C) (Temporal)  Resp 18   Wt 163 lb 4.8 oz (74.1 kg)   SpO2 98%   BMI 26.36 kg/m  Body mass index is 26.36 kg/m. Physical Exam: General Appearance:  Alert, cooperative, no distress, appears stated age  Head:  Normocephalic, without obvious abnormality, atraumatic  Eyes:  Conjunctiva clear, EOM's intact  Nose: Nares normal, hypertrophic turbinates, normal mucosa, and no visible anterior polyps, visibly erythematous with prominent vessels in bilateral naris without active bleeding  Throat: Lips, tongue normal; teeth and gums normal, normal posterior oropharynx  Neck: Supple, symmetrical  Lungs:   clear to auscultation bilaterally, Respirations unlabored, no coughing  Heart:  regular rate and rhythm and no murmur, Appears well perfused  Extremities: No edema  Skin: Skin color, texture, turgor normal, no rashes or lesions on visualized portions of skin  Neurologic: No gross deficits    Spirometry:  Tracings reviewed. His effort: Good reproducible efforts. FVC: 2.66L FEV1: 1.64L, 62% predicted FEV1/FVC ratio: 78% Interpretation: Spirometry consistent with moderate obstructive disease.  Please see scanned spirometry results for  details.  Assessment/Plan  Mr. Yasui continues to struggle with his asthma and show moderate obstruction on his spirometry though he is much improved since prior to starting [place].  He was also better off while doing Dupixent plus Tezspire (no longer allowed by his insurance). An attempt to reduce his systemic side effects, we discussed adding on Pulmicort 0.5 mg daily as part of his asthma regimen, consider increasing to twice daily if not controlled on once daily. We also explored the idea of him using a Jak inhibitor for his eczema since he still having some breakthrough symptoms.  Perhaps this would allow for him to resume Tezspire injections. He will discuss with Dermatology who manages this for him.    Severe persistent asthma-not well controlled Discussed the options with his asthma. Continue Dulera 200 mcg 2 puffs twice a day with spacer to help prevent cough and wheeze Start Pulmicort 0.5 mg daily via nebulizer, use with Dulera and Spiriva Continue Spiriva Respimat 1.25 mcg 2 puffs once a day to help prevent cough and wheeze Continue montelukast 10 mg at night to help prevent cough and wheeze.  Discussed only once daily use. May use albuterol 2 puffs every 4-6 hours as needed for cough, wheeze, tightness in chest, or shortness of breath. For Asthma Flares: increase pulmicort 0.5 mg to twice a day in addition to your medications as above Asthma control goals:  Full participation in all desired activities (may need albuterol before activity) Albuterol use two time or less a week on average (not counting use with activity) Cough interfering with sleep two time or less a month Oral steroids no more than once a year No hospitalizations   Allergic rhinoconjunctivitis-uncontrolled Continue Pataday (olopatadine) 0.2% using 1 drop each eye once a day as needed for itchy watery eyes.   Continue Xyzal (levocetirizine) 5 mg once a day as needed for runny nose/itching.  Try taking Xyzal more  consistently while her allergies are worse Continue Ayr gel for nose as well as nasal saline rinses Discussed adding Mucinex [phone] mg twice daily as needed for increased mucus production.  Take with ample amounts of water  Atopic dermatitis-partially controlled Continue dupilumab injections and topical agents as per your dermatologist.   Talk to your dermatologist about other possible options for your eczema such as JAK inhibitors which may allow [place] to put you back on Tezspire injections-hand out provided for one as an example   Anaphylactic shock due  to food-stable Avoid fish and shellfish. In case of an allergic reaction, give Benadryl 4 teaspoonfuls every 4 hours, and if life-threatening symptoms occur, inject with EpiPen 0.3 mg.  Epistaxis/hereditary hemorrhagic telangiectasia-stable Continue to follow up with your ENT for this You may use saline nasal gel as needed  Heartburn-stable. Continue Pepcid (famotidine) 20 mg once a day.    Please let us know if this treatment plan is not working well for you Schedule a follow-up appointment in 2 months, sooner if needed.   Sigurd Sos, MD  [facility]

## 2022-05-13 ENCOUNTER — Ambulatory Visit: Payer: 59 | Admitting: Internal Medicine

## 2022-05-13 ENCOUNTER — Encounter: Payer: Self-pay | Admitting: Internal Medicine

## 2022-05-13 VITALS — BP 110/78 | HR 87 | Temp 98.2°F | Resp 18 | Wt 163.3 lb

## 2022-05-13 DIAGNOSIS — J3089 Other allergic rhinitis: Secondary | ICD-10-CM | POA: Diagnosis not present

## 2022-05-13 DIAGNOSIS — I780 Hereditary hemorrhagic telangiectasia: Secondary | ICD-10-CM

## 2022-05-13 DIAGNOSIS — L2089 Other atopic dermatitis: Secondary | ICD-10-CM

## 2022-05-13 DIAGNOSIS — J4550 Severe persistent asthma, uncomplicated: Secondary | ICD-10-CM

## 2022-05-13 DIAGNOSIS — R040 Epistaxis: Secondary | ICD-10-CM

## 2022-05-13 DIAGNOSIS — H1013 Acute atopic conjunctivitis, bilateral: Secondary | ICD-10-CM | POA: Diagnosis not present

## 2022-05-13 DIAGNOSIS — T7800XD Anaphylactic reaction due to unspecified food, subsequent encounter: Secondary | ICD-10-CM

## 2022-05-13 MED ORDER — BUDESONIDE 0.5 MG/2ML IN SUSP: 0.5000 mg | Freq: Every day | RESPIRATORY_TRACT | 3 refills | Status: DC

## 2022-05-13 NOTE — Patient Instructions (Addendum)
Severe persistent asthma-not well controlled Discussed the options with his asthma. Continue Dulera 200 mcg 2 puffs twice a day with spacer to help prevent cough and wheeze Start Pulmicort 0.5 mg daily via nebulizer, use with Dulera and Spiriva Continue Spiriva Respimat 1.25 mcg 2 puffs once a day to help prevent cough and wheeze Continue montelukast 10 mg at night to help prevent cough and wheeze.  May use albuterol 2 puffs every 4-6 hours as needed for cough, wheeze, tightness in chest, or shortness of breath. For Asthma Flares: increase pulmicort 0.5 mg to twice a day in addition to your medications as above Asthma control goals:  Full participation in all desired activities (may need albuterol before activity) Albuterol use two time or less a week on average (not counting use with activity) Cough interfering with sleep two time or less a month Oral steroids no more than once a year No hospitalizations   Allergic rhinoconjunctivitis Continue Pataday (olopatadine) 0.2% using 1 drop each eye once a day as needed for itchy watery eyes.   Continue Xyzal (levocetirizine) 5 mg once a day as needed for runny nose/itching.  Try taking Xyzal more consistently while her allergies are worse Continue Ayr gel for nose as well as nasal saline rinses Discussed adding Mucinex [phone] mg twice daily as needed for increased mucus production.  Take with ample amounts of water  Atopic dermatitis Continue dupilumab injections and topical agents as per your dermatologist.   Talk to your dermatologist about other possible options for your eczema such as JAK inhibitors which may allow [place] to put you back on Tezspire injections-hand out provided for one as an example   Anaphylactic shock due to food Avoid fish and shellfish. In case of an allergic reaction, give Benadryl 4 teaspoonfuls every 4 hours, and if life-threatening symptoms occur, inject with EpiPen 0.3 mg.  Epistaxis/hereditary hemorrhagic  telangiectasia Continue to follow up with your ENT for this You may use saline nasal gel as needed  Heartburn Continue Pepcid (famotidine) 20 mg once a day.    Please let us know if this treatment plan is not working well for you Schedule a follow-up appointment in 2 months, sooner if needed.

## 2022-05-13 NOTE — Telephone Encounter (Signed)
This medication was stopped in May. He should just be taking Pepcid. He does have an appointment with Dr. Simona Huh this morning @ 10:15 AM if anything needs to be changed. (Routing to Dr. Simona Huh also since she is seeing him this morning).

## 2022-05-13 NOTE — Telephone Encounter (Signed)
Thank you. Will discuss at appointment.

## 2022-06-02 ENCOUNTER — Other Ambulatory Visit: Payer: Self-pay | Admitting: Family

## 2022-06-03 NOTE — Telephone Encounter (Signed)
Insurance prefers qvar please advise to change

## 2022-06-03 NOTE — Telephone Encounter (Signed)
Qvar is just an ICS (inhaled corticosteroid) This would be a step down. Which is not what is needed.What ICS/LABA combinations are preferred such as Symbicort, Advair, or Breo.   Thank you, Althea Charon, FNP

## 2022-06-04 ENCOUNTER — Other Ambulatory Visit: Payer: Self-pay

## 2022-06-04 MED ORDER — BUDESONIDE-FORMOTEROL FUMARATE 160-4.5 MCG/ACT IN AERO: INHALATION_SPRAY | RESPIRATORY_TRACT | 5 refills | Status: DC

## 2022-06-04 NOTE — Telephone Encounter (Signed)
Please change to Symbicort 160/4.5 mcg 2 puffs twice a day with spacer. Quantity #1 with 5 refills. Please let Mr. Hemberger know the reason for the change.

## 2022-06-05 ENCOUNTER — Other Ambulatory Visit: Payer: Self-pay

## 2022-06-05 MED ORDER — DULERA 200-5 MCG/ACT IN AERO: INHALATION_SPRAY | RESPIRATORY_TRACT | 1 refills | Status: DC

## 2022-06-05 NOTE — Telephone Encounter (Signed)
Let patient know that we sent in his dulera 220mg. Patient aware.

## 2022-07-20 NOTE — Progress Notes (Unsigned)
FOLLOW UP Date of Service/Encounter:  07/22/22   Subjective:  Alexander Hodge (DOB: [birth year]) is a 61 y.o. male who returns to the [facility] on 07/22/2022 in re-evaluation of the following: severe persistent asthma, allergic rhinitis, allergic conjunctivitis, atopic dermatitis, anaphylactic shock due to food, hereditary hemorrhagic telangiectasia, and epistaxis History obtained from: chart review and patient.  For Review, LV was on 05/13/22  with Dr.Nyeli Holtmeyer seen for routine follow-up. His asthma was flared after smoke exposure. Reported using montelukast BID and we discussed only using once daily due to potential for side effects. FEV1 62%. Discussed possible switch to JAK inhibitor to see if this would allow addition of Tezspire since this was discontinued due to dual biologics therapy with Dupixent for eczema. He was to discuss with his dermatologist.  Today presents for follow-up. Allergies flares in fall-sneezing, itching ear, throat and eyes.  He is taking Allegra. He has needed mucinex in the past. Asthma: He is not using rescue inhaler more than 3 to 4 times per month.  Feels that his asthma has controlled at the moment.  Eczema: Dupixent continue to control his eczema. Now he is having some issues getting Dupixent He has not needed steroids, antibiotics or ED visits since his last visit. No accidental exposure to fish or shellfish. Heartburn is not controlled. Occasionally takes alka seltzer or pepto bismol. He has had hiatal hernia repair years ago which was ineffective. He also reports ongoing fatigue for which she plans on following up with his primary care physician.  Allergies as of 07/22/2022       Reactions   Eggs Or Egg-derived Products Shortness Of Breath   Eicosapentaenoic Acid (epa) Rash, Hives   Unknown reaction Unknown reaction   Poultry Meal Shortness Of Breath   Shellfish Allergy Shortness Of Breath   Fish Allergy Other (See Comments)   Unknown  reaction Unknown reaction Unknown reaction   Fish-derived Products Rash   Unknown reaction        Medication List        Accurate as of July 22, 2022  5:09 PM. If you have any questions, ask your nurse or doctor.          STOP taking these medications    albuterol 108 (90 Base) MCG/ACT inhaler Commonly known as: VENTOLIN HFA Stopped by: Clemon Chambers, MD   budesonide-formoterol 160-4.5 MCG/ACT inhaler Commonly known as: Symbicort Stopped by: Clemon Chambers, MD   Olopatadine HCl 0.2 % Soln Stopped by: Clemon Chambers, MD       TAKE these medications    acetaminophen 500 MG tablet Commonly known as: TYLENOL Take by mouth.   Airsupra 90-80 MCG/ACT Aero Generic drug: Albuterol-Budesonide Inhale 2 Inhalations into the lungs as needed (maximum of 12 inhalations daily). Started by: Clemon Chambers, MD   alclomethasone 0.05 % ointment Commonly known as: ACLOVATE 1 application 2 times daily.   alendronate 70 MG tablet Commonly known as: FOSAMAX Take 70 mg by mouth once a week.   aminocaproic acid 25 % solution Commonly known as: AMICAR   ammonium lactate 12 % lotion Commonly known as: LAC-HYDRIN 1(ONE) APPLICATION(S) TOPICAL 2(TWO) TIMES A DAY   atorvastatin 40 MG tablet Commonly known as: LIPITOR Take 40 mg by mouth daily.   budesonide 0.5 MG/2ML nebulizer solution Commonly known as: Pulmicort Take 2 mLs (0.5 mg total) by nebulization daily.   buPROPion 150 MG 24 hr tablet Commonly known as: WELLBUTRIN XL Take 1 tablet by  mouth daily.   clobetasol cream 0.05 % Commonly known as: TEMOVATE Apply topically.   cromolyn 4 % ophthalmic solution Commonly known as: OPTICROM Place 1 drop into both eyes 4 (four) times daily as needed. Started by: Clemon Chambers, MD   cyclobenzaprine 5 MG tablet Commonly known as: FLEXERIL Take 10 mg by mouth at bedtime.   desonide 0.05 % ointment Commonly known as: DESOWEN Apply topically.   diclofenac Sodium 1 %  Gel Commonly known as: VOLTAREN Apply small amount sparingly to the right posterior heel and achilles tendon   diphenhydramine-acetaminophen 25-500 MG Tabs tablet Commonly known as: TYLENOL PM Take by mouth.   Dulera 200-5 MCG/ACT Aero Generic drug: mometasone-formoterol 2 puffs every 12 hours to prevent coughing or wheezing.   DULoxetine 60 MG capsule Commonly known as: CYMBALTA TAKE 1 CAPSULE BY MOUTH ONCE DAILY WITH FOOD   dupilumab 300 MG/2ML prefilled syringe Commonly known as: DUPIXENT Inject into the skin.   EPINEPHrine 0.3 mg/0.3 mL Soaj injection Commonly known as: EPI-PEN Use as directed for severe allergic reaction   ergocalciferol 1.25 MG (50000 UT) capsule Commonly known as: VITAMIN D2 TAKE 1 CAPSULE BY MOUTH ONE TIME PER WEEK   erythromycin ophthalmic ointment SMARTSIG:In Eye(s) Every Night PRN   famotidine 20 MG tablet Commonly known as: PEPCID Take 1 tablet (20 mg total) by mouth daily.   fluocinolone 0.01 % external solution Commonly known as: SYNALAR Use 1 drop in each ear canal up to two times daily as needed for itchy ears. Started by: Clemon Chambers, MD   fluticasone 0.05 % cream Commonly known as: CUTIVATE   levocetirizine 5 MG tablet Commonly known as: XYZAL Take 1 tablet by mouth every evening.   lidocaine 5 % Commonly known as: [place] onto the skin.   montelukast 10 MG tablet Commonly known as: SINGULAIR Take 1 tablet (10 mg total) by mouth at bedtime.   mupirocin ointment 2 % Commonly known as: BACTROBAN Apply to affected areas inside nose 3 times daily for 14 days   pantoprazole 40 MG tablet Commonly known as: PROTONIX Take 1 tablet (40 mg total) by mouth daily before breakfast. Started by: Clemon Chambers, MD   pimecrolimus 1 % cream Commonly known as: ELIDEL   pregabalin 100 MG capsule Commonly known as: LYRICA Take 100 mg by mouth 3 (three) times daily.   pregabalin 50 MG capsule Commonly known as: LYRICA Take  50 mg (1 capsule) in the morning, 50 mg ( 1 capsule)  in the evening, and 100 mg ( 2 capsules)  at bedtime   saline Gel APPLY TO NOSE ONCE OR TWICE A DAY FOR NASAL IRRITATION.   Sarna Sensitive 1 % Lotn Generic drug: pramoxine 1(ONE) APPLICATION(S) TOPICAL EVERY DAY   Spiriva Respimat 1.25 MCG/ACT Aers Generic drug: Tiotropium Bromide Monohydrate Inhale 2 puffs into the lungs daily.   tacrolimus 0.1 % ointment Commonly known as: PROTOPIC Apply 1 application topically 2 (two) times daily as needed.   tadalafil 5 MG tablet Commonly known as: CIALIS TAKE 1 TABLET BY MOUTH  DAILY AS NEEDED FOR UP TO  30 DAYS FOR ERECTILE  DYSFUNCTION.   tamoxifen 20 MG tablet Commonly known as: NOLVADEX Take 20 mg by mouth.   tamsulosin 0.4 MG Caps capsule Commonly known as: FLOMAX   triamcinolone cream 0.1 % Commonly known as: KENALOG 1 application as needed.       Past Medical History:  Diagnosis Date   Asthma    Past  Surgical History:  Procedure Laterality Date   MASS EXCISION  02/21/2012   Procedure: MINOR EXCISION OF MASS;  Surgeon: Myrtha Mantis., MD;  Location: [facility];  Service: Ophthalmology;;        cyst removal  left eye both lids   portacath removal  07/2017   infection   Otherwise, there have been no changes to his past medical history, surgical history, family history, or social history.  ROS: All others negative except as noted per HPI.   Objective:  There were no vitals taken for this visit. There is no height or weight on file to calculate BMI. Physical Exam: General Appearance:  Alert, cooperative, no distress, appears stated age  Head:  Normocephalic, without obvious abnormality, atraumatic  Eyes:  Conjunctiva clear, EOM's intact  Nose: Nares normal,  visible bleed in left nostril, not dripping, right nostril obstructed from dried mucus and hypertrophic tissue and hypertrophic turbinates  Throat: Lips, tongue normal; teeth and gums  normal, normal posterior oropharynx  Neck: Supple, symmetrical  Lungs:   clear to auscultation bilaterally, Respirations unlabored, no coughing  Heart:  regular rate and rhythm and no murmur, Appears well perfused  Extremities: No edema  Skin: Skin color, texture, turgor normal, no rashes or lesions on visualized portions of skin  Neurologic: No gross deficits   Spirometry:  Tracings reviewed. His effort: Good reproducible efforts. FVC: 2.37L FEV1: 1.23L, 46% predicted FEV1/FVC ratio: 66% Interpretation: Spirometry consistent with severe obstructive disease.  Please see scanned spirometry results for details.  Assessment/Plan   Severe persistent asthma-not controlled Continue Dulera 200 mcg 2 puffs twice a day with spacer to help prevent cough and wheeze Continue Spiriva Respimat 1.25 mcg 2 puffs once a day to help prevent cough and wheeze Continue montelukast 10 mg at night to help prevent cough and wheeze.   During flares: Start Pulmicort 0.5 mg daily via nebulizer, use with Dulera and Spiriva for 2 weeks or until symptoms improve.  RESCUE: May use AirSUPRA 2 puffs as needed  for cough, wheeze, tightness in chest, or shortness of breath. Not to exceed 12 puffs in one day. Use in place of albuterol.  Asthma control goals:  Full participation in all desired activities (may need albuterol before activity) Albuterol use two time or less a week on average (not counting use with activity) Cough interfering with sleep two time or less a month Oral steroids no more than once a year No hospitalizations  Allergic rhinoconjunctivitis-not controlled Continue Pataday (olopatadine) 0.2% using 1 drop each eye once a day as needed or cromolyn 1 drop up to 4 times daily as needed. for itchy watery eyes.   Continue Allegra 180 mg daily as needed. Continue Ayr gel for nose as well as nasal saline rinses Discussed adding Mucinex [phone] mg twice daily as needed for increased mucus production.   Take with ample amounts of water  Atopic dermatitis-controlled Continue dupilumab injections and topical agents as per your dermatologist.   - start fluicionolone drops 1 drop in each ear daily as needed for itchy ear canals  Anaphylactic shock due to food-stable Avoid fish and shellfish. In case of an allergic reaction, give Benadryl 4 teaspoonfuls every 4 hours, and if life-threatening symptoms occur, inject with EpiPen 0.3 mg.  Epistaxis/hereditary hemorrhagic telangiectasia-stable Continue to follow up with your ENT for this You may use saline nasal gel as needed  Heartburn-not controlled Start protonix (pantopraxole) 40 mg daily , take 30 minutes prior to breakfast -  lifestyle and diet modifications  Please let us know if this treatment plan is not working well for you Schedule a follow-up appointment in 3-4 months, sooner if needed.   Sigurd Sos, MD  [facility]

## 2022-07-22 ENCOUNTER — Ambulatory Visit: Payer: 59 | Admitting: Internal Medicine

## 2022-07-22 ENCOUNTER — Encounter: Payer: Self-pay | Admitting: Internal Medicine

## 2022-07-22 ENCOUNTER — Telehealth: Payer: Self-pay | Admitting: *Deleted

## 2022-07-22 VITALS — BP 102/68 | HR 77 | Temp 98.4°F | Resp 18 | Wt 172.7 lb

## 2022-07-22 DIAGNOSIS — L2089 Other atopic dermatitis: Secondary | ICD-10-CM

## 2022-07-22 DIAGNOSIS — R040 Epistaxis: Secondary | ICD-10-CM

## 2022-07-22 DIAGNOSIS — H1013 Acute atopic conjunctivitis, bilateral: Secondary | ICD-10-CM | POA: Diagnosis not present

## 2022-07-22 DIAGNOSIS — K219 Gastro-esophageal reflux disease without esophagitis: Secondary | ICD-10-CM

## 2022-07-22 DIAGNOSIS — T7800XD Anaphylactic reaction due to unspecified food, subsequent encounter: Secondary | ICD-10-CM

## 2022-07-22 DIAGNOSIS — I780 Hereditary hemorrhagic telangiectasia: Secondary | ICD-10-CM

## 2022-07-22 DIAGNOSIS — J3089 Other allergic rhinitis: Secondary | ICD-10-CM

## 2022-07-22 DIAGNOSIS — J4550 Severe persistent asthma, uncomplicated: Secondary | ICD-10-CM | POA: Diagnosis not present

## 2022-07-22 MED ORDER — FLUOCINOLONE ACETONIDE 0.01 % EX SOLN: CUTANEOUS | 1 refills | Status: DC

## 2022-07-22 MED ORDER — CROMOLYN SODIUM 4 % OP SOLN: 1.0000 [drp] | Freq: Four times a day (QID) | OPHTHALMIC | 3 refills | Status: DC | PRN

## 2022-07-22 MED ORDER — PANTOPRAZOLE SODIUM 40 MG PO TBEC: 40.0000 mg | DELAYED_RELEASE_TABLET | Freq: Every day | ORAL | 3 refills | Status: DC

## 2022-07-22 MED ORDER — AIRSUPRA 90-80 MCG/ACT IN AERO: 2.0000 | INHALATION_SPRAY | RESPIRATORY_TRACT | 3 refills | Status: DC | PRN

## 2022-07-22 NOTE — Telephone Encounter (Signed)
Thanks Tammy 

## 2022-07-22 NOTE — Patient Instructions (Addendum)
Severe persistent asthma Continue Dulera 200 mcg 2 puffs twice a day with spacer to help prevent cough and wheeze Continue Spiriva Respimat 1.25 mcg 2 puffs once a day to help prevent cough and wheeze Continue montelukast 10 mg at night to help prevent cough and wheeze.   During flares: Start Pulmicort 0.5 mg daily via nebulizer, use with Dulera and Spiriva for 2 weeks or until symptoms improve.  RESCUE: May use AirSUPRA 2 puffs as needed  for cough, wheeze, tightness in chest, or shortness of breath. Not to exceed 12 puffs in one day. Use in place of albuterol.  Asthma control goals:  Full participation in all desired activities (may need albuterol before activity) Albuterol use two time or less a week on average (not counting use with activity) Cough interfering with sleep two time or less a month Oral steroids no more than once a year No hospitalizations  Allergic rhinoconjunctivitis Continue Pataday (olopatadine) 0.2% using 1 drop each eye once a day as needed or cromolyn 1 drop up to 4 times daily as needed. for itchy watery eyes.   Continue Allegra 180 mg daily as needed. Continue Ayr gel for nose as well as nasal saline rinses Discussed adding Mucinex [phone] mg twice daily as needed for increased mucus production.  Take with ample amounts of water  Atopic dermatitis Continue dupilumab injections and topical agents as per your dermatologist.   - start fluicionolone drops 1 drop in each ear daily as needed for itchy ear canals  Anaphylactic shock due to food Avoid fish and shellfish. In case of an allergic reaction, give Benadryl 4 teaspoonfuls every 4 hours, and if life-threatening symptoms occur, inject with EpiPen 0.3 mg.  Epistaxis/hereditary hemorrhagic telangiectasia Continue to follow up with your ENT for this You may use saline nasal gel as needed  Heartburn Start protonix (pantopraxole) 40 mg daily , take 30 minutes prior to breakfast - lifestyle and diet  modifications  Please let us know if this treatment plan is not working well for you Schedule a follow-up appointment in 3-4 months, sooner if needed.   Continue Dulera 200 mcg 2 puffs twice a day with spacer to help prevent cough and wheeze Continue Spiriva Respimat 1.25 mcg 2 puffs once a day to help prevent cough and wheeze Continue montelukast 10 mg at night to help prevent cough and wheeze.   During flares: Start Pulmicort 0.5 mg daily via nebulizer, use with Dulera and Spiriva for 2 weeks or until symptoms improve.  RESCUE: May use AirSUPRA 2 puffs as needed  for cough, wheeze, tightness in chest, or shortness of breath. Not to exceed 12 puffs in one day. Use in place of albuterol.

## 2022-07-22 NOTE — Telephone Encounter (Signed)
-----   Message from Clemon Chambers, MD sent at 07/22/2022  3:53 PM EDT ----- Alexander Hodge- Alexander Hodge is having trouble getting his dupixent covered (given through dermatology) due to an outstanding balance with Te[employer]spire. Do we have any of his Cheron Every and can we return? He was told to ask by his pharmacy. Can you please call him to help sort this out?   Thanks

## 2022-07-22 NOTE — Telephone Encounter (Signed)
L/m for patient to contact me regarding this issue. I have explained to him several times back in the spring all he had to do was call the [employer] billing dept and give them copay card info that was emailed to him and ask them to bill copay card. Since I have told him multiple times how to handle I am certain that is the issue still. I did call and l/m for patient to contact me and will tell this again.

## 2022-07-25 ENCOUNTER — Other Ambulatory Visit: Payer: Self-pay | Admitting: Family

## 2022-11-25 ENCOUNTER — Ambulatory Visit: Payer: 59 | Admitting: Internal Medicine

## 2023-01-18 NOTE — Progress Notes (Unsigned)
FOLLOW UP Date of Service/Encounter:  01/20/23   Subjective:  Alexander Hodge (DOB: 06-02-1961) is a 62 y.o. male who [facility] in re-evaluation of the following: severe persistent asthma, allergic rhinitis, allergic conjunctivitis, atopic dermatitis, anaphylactic shock due to food, hereditary hemorrhagic telangiectasia, and epistaxis  History obtained from: chart review and patient.  For Review, LV was on 07/22/22  with Dr.Jonee Lamore seen for routine follow-up. See below for summary of history and diagnostics.  Therapeutic plans/changes recommended: he was doing well on dupixent with regards to asthma and eczema. Having breakthrough allergy symptoms due to fall allergens. FEV1 46% wwith obstruction. We continued Dulera 200 + Spiriva; switched to AirSupra for rescue. On montelukast. Continue dupixent (through dermatology).  He reported fatigue with plan to follow-up with PCP.  Pertinent History/Diagnostics:  Asthma: Severe persistent, still symptomatic on dupixent. Briefly on teszpire + dupixent which helped with asthma, but insurance refused to pay for 2 biologics. Allergic Rhinitis:  Has hereditary hemorrhagic telangiectasia.  Has reflux, hx of hiatal hernia repair.  Eczema: Follows with Derm on Dupixent. Food allergy  Avoids fish and shellfish. Additional medical hx: MGUS; follows with heme/onc.  ---------------------------------------------- Today presents for follow-up. However he is sick today. Having thick yellow mucus and coughing for the past week. He is wheezing, back feels tights.  He has had some vomiting. Sweating really bad. He had not been tested for anything. He thinks he had a fever last night because he was sweaty.  He also has shortness of breath. Facial pressure with changes to smell and taste. He is still on dupixent, due for an injection today. He is using fluocinolone drops in his ears which help with ear dryness/itching.  Eczema under excellent control. He continues with derm.  He still has heartburn but not as bad as before, and he does feel improved on protonix.  He has not seen GI in a some time, but does remember someone putting in a referral.  He has been having significant nosebleeds out of no where. Continues to follow with ENT and heme/onc. His allergies were starting to act up as of a few weeks ago. He is taking allegra but not helping.  He does have Airsupra. Using it daily for the most part, twice daily since onset of current illness. No accidental food allergy exposures.   Allergies as of 01/20/2023       Reactions   Egg-derived Products Shortness Of Breath   Eicosapentaenoic Acid (epa) Rash, Hives   Unknown reaction Unknown reaction   Poultry Meal Shortness Of Breath   Shellfish Allergy Shortness Of Breath   Fish Allergy Other (See Comments)   Unknown reaction Unknown reaction Unknown reaction   Fish-derived Products Rash   Unknown reaction        Medication List        Accurate as of January 20, 2023 10:46 AM. If you have any questions, ask your nurse or doctor.          acetaminophen 500 MG tablet Commonly known as: TYLENOL Take by mouth.   Airsupra 90-80 MCG/ACT Aero Generic drug: Albuterol-Budesonide Inhale 2 Inhalations into the lungs as needed (maximum of 12 inhalations daily).   albuterol 108 (90 Base) MCG/ACT inhaler Commonly known as: VENTOLIN HFA INHALE 2 PUFFS BY MOUTH EVERY 4 HOURS AS NEEDED FOR WHEEZE OR FOR SHORTNESS OF BREATH   alclomethasone 0.05 % ointment Commonly known as: ACLOVATE 1 application 2 times daily.   alendronate  70 MG tablet Commonly known as: FOSAMAX Take 70 mg by mouth once a week.   aminocaproic acid 25 % solution Commonly known as: AMICAR   ammonium lactate 12 % lotion Commonly known as: LAC-HYDRIN 1(ONE) APPLICATION(S) TOPICAL 2(TWO) TIMES A DAY   atorvastatin 40 MG tablet Commonly known as: LIPITOR Take 40 mg by mouth  daily.   budesonide 0.5 MG/2ML nebulizer solution Commonly known as: Pulmicort Take 2 mLs (0.5 mg total) by nebulization daily.   buPROPion 150 MG 24 hr tablet Commonly known as: WELLBUTRIN XL Take 1 tablet by mouth daily.   clobetasol cream 0.05 % Commonly known as: TEMOVATE Apply topically.   cromolyn 4 % ophthalmic solution Commonly known as: OPTICROM Place 1 drop into both eyes 4 (four) times daily as needed.   cyclobenzaprine 5 MG tablet Commonly known as: FLEXERIL Take 10 mg by mouth at bedtime.   desonide 0.05 % ointment Commonly known as: DESOWEN Apply topically.   diclofenac Sodium 1 % Gel Commonly known as: VOLTAREN Apply small amount sparingly to the right posterior heel and achilles tendon   diphenhydramine-acetaminophen 25-500 MG Tabs tablet Commonly known as: TYLENOL PM Take by mouth.   Dulera 200-5 MCG/ACT Aero Generic drug: mometasone-formoterol 2 puffs every 12 hours to prevent coughing or wheezing.   DULoxetine 60 MG capsule Commonly known as: CYMBALTA TAKE 1 CAPSULE BY MOUTH ONCE DAILY WITH FOOD   dupilumab 300 MG/2ML prefilled syringe Commonly known as: DUPIXENT Inject into the skin.   EPINEPHrine 0.3 mg/0.3 mL Soaj injection Commonly known as: EPI-PEN Use as directed for severe allergic reaction   ergocalciferol 1.25 MG (50000 UT) capsule Commonly known as: VITAMIN D2 TAKE 1 CAPSULE BY MOUTH ONE TIME PER WEEK   erythromycin ophthalmic ointment SMARTSIG:In Eye(s) Every Night PRN   famotidine 20 MG tablet Commonly known as: PEPCID Take 1 tablet (20 mg total) by mouth daily.   fluocinolone 0.01 % external solution Commonly known as: SYNALAR Use 1 drop in each ear canal up to two times daily as needed for itchy ears.   fluticasone 0.05 % cream Commonly known as: CUTIVATE   levocetirizine 5 MG tablet Commonly known as: XYZAL Take 1 tablet by mouth every evening.   lidocaine 5 % Commonly known as: LIDODERM Place onto the skin.    montelukast 10 MG tablet Commonly known as: SINGULAIR Take 1 tablet (10 mg total) by mouth at bedtime.   mupirocin ointment 2 % Commonly known as: BACTROBAN Apply to affected areas inside nose 3 times daily for 14 days   pantoprazole 40 MG tablet Commonly known as: PROTONIX Take 1 tablet (40 mg total) by mouth daily before breakfast.   pimecrolimus 1 % cream Commonly known as: ELIDEL   pregabalin 100 MG capsule Commonly known as: LYRICA Take 100 mg by mouth 3 (three) times daily.   pregabalin 50 MG capsule Commonly known as: LYRICA Take 50 mg (1 capsule) in the morning, 50 mg ( 1 capsule)  in the evening, and 100 mg ( 2 capsules)  at bedtime   saline Gel APPLY TO NOSE ONCE OR TWICE A DAY FOR NASAL IRRITATION.   Sarna Sensitive 1 % Lotn Generic drug: pramoxine 1(ONE) APPLICATION(S) TOPICAL EVERY DAY   Spiriva Respimat 1.25 MCG/ACT Aers Generic drug: Tiotropium Bromide Monohydrate Inhale 2 puffs into the lungs daily.   tacrolimus 0.1 % ointment Commonly known as: PROTOPIC Apply 1 application topically 2 (two) times daily as needed.   tadalafil 5 MG tablet Commonly known as:  CIALIS TAKE 1 TABLET BY MOUTH  DAILY AS NEEDED FOR UP TO  30 DAYS FOR ERECTILE  DYSFUNCTION.   tamoxifen 20 MG tablet Commonly known as: NOLVADEX Take 20 mg by mouth.   tamsulosin 0.4 MG Caps capsule Commonly known as: FLOMAX   triamcinolone cream 0.1 % Commonly known as: KENALOG 1 application as needed.       Past Medical History:  Diagnosis Date   Asthma    Past Surgical History:  Procedure Laterality Date   MASS EXCISION  02/21/2012   Procedure: MINOR EXCISION OF MASS;  Surgeon: Vita Erm., MD;  Location: [facility];  Service: Ophthalmology;;        cyst removal  left eye both lids   portacath removal  07/2017   infection   Otherwise, there have been no changes to his past medical history, surgical history, family history, or social history.  ROS:  All others negative except as noted per HPI.   Objective:  BP 118/78 (BP Location: Left Arm, Patient Position: Sitting, Cuff Size: Normal)   Pulse 96   Temp 99.1 F (37.3 C) (Temporal)   Resp 20   Wt 160 lb 12.8 oz (72.9 kg)   SpO2 97%   BMI 25.95 kg/m  Body mass index is 25.95 kg/m. Physical Exam: General Appearance:  Alert, cooperative, no distress, appears stated age  Head:  Normocephalic, without obvious abnormality, atraumatic  Eyes:  Conjunctiva clear, EOM's intact  Nose: Nares normal,  some visible blood in left nare and hypertrophic turbinates  Throat: Lips, tongue normal; teeth and gums normal, normal posterior oropharynx and + cobblestoning  Neck: Supple, symmetrical  Lungs:   Tight but moving air and clear to auscultation bilaterally, Respirations unlabored, intermittent dry coughing  Heart:  regular rate and rhythm and no murmur, Appears well perfused  Extremities: No edema  Skin: Skin color, texture, turgor normal and no rashes or lesions on visualized portions of skin  Neurologic: No gross deficits   Spirometry:  Deferred due to ongoing illness  Assessment/Plan   Severe persistent asthma with current exacerbation due to acute sinusitis:  - 40 mg IM depo in clinic - tomorrow start 40 mg prednisone and continue daily for 4 days - today start augmentin 875 mg twice daily for 10 days, take with probiotics or steroids  Continue Dulera 200 mcg 2 puffs twice a day with spacer to help prevent cough and wheeze Continue Spiriva Respimat 1.25 mcg 2 puffs once a day to help prevent cough and wheeze Continue montelukast 10 mg at night to help prevent cough and wheeze.   During flares: Start Pulmicort 0.5 mg daily via nebulizer, use with Dulera and Spiriva for 2 weeks or until symptoms improve.  RESCUE: May use AirSUPRA 2 puffs as needed  for cough, wheeze, tightness in chest, or shortness of breath. Not to exceed 12 puffs in one day. Use in place of albuterol.  Asthma  control goals:  Full participation in all desired activities (may need albuterol before activity) Albuterol use two time or less a week on average (not counting use with activity) Cough interfering with sleep two time or less a month Oral steroids no more than once a year No hospitalizations  Allergic rhinoconjunctivitis-partially controlled Continue Pataday (olopatadine) 0.2% using 1 drop each eye once a day as needed or cromolyn 1 drop up to 4 times daily as needed. for itchy watery eyes.   Continue Allegra 180 mg daily as needed. Can take 2  tablets on "bad allergy days".  Continue Ayr gel for nose as well as nasal saline rinses Discussed adding Mucinex [phone] mg twice daily as needed for increased mucus production.  Take with ample amounts of water  Atopic dermatitis-controlled Continue dupilumab injections and topical agents as per your dermatologist.   - fluicionolone drops 1 drop in each ear daily as needed for itchy ear canals  Anaphylactic shock due to food-stable Avoid fish and shellfish. In case of an allergic reaction, give Benadryl 4 teaspoonfuls every 4 hours, and if life-threatening symptoms occur, inject with EpiPen 0.3 mg.  Epistaxis/hereditary hemorrhagic telangiectasia-stable Continue to follow up with your ENT for this You may use saline nasal gel as needed  Heartburn-controlled Continue protonix (pantopraxole) 40 mg daily , take 30 minutes prior to breakfast - lifestyle and diet modifications - check with your referring doctor about the GI referral  Please let us know if this treatment plan is not working well for you Schedule a follow-up appointment in 6 months, sooner if needed.   Tonny Bollman, MD  [facility]

## 2023-01-20 ENCOUNTER — Ambulatory Visit: Payer: 59 | Admitting: Internal Medicine

## 2023-01-20 ENCOUNTER — Other Ambulatory Visit: Payer: Self-pay | Admitting: Internal Medicine

## 2023-01-20 ENCOUNTER — Encounter: Payer: Self-pay | Admitting: Internal Medicine

## 2023-01-20 VITALS — BP 118/78 | HR 96 | Temp 99.1°F | Resp 20 | Wt 160.8 lb

## 2023-01-20 DIAGNOSIS — J3089 Other allergic rhinitis: Secondary | ICD-10-CM | POA: Diagnosis not present

## 2023-01-20 DIAGNOSIS — I780 Hereditary hemorrhagic telangiectasia: Secondary | ICD-10-CM | POA: Diagnosis not present

## 2023-01-20 DIAGNOSIS — H1013 Acute atopic conjunctivitis, bilateral: Secondary | ICD-10-CM

## 2023-01-20 DIAGNOSIS — L2089 Other atopic dermatitis: Secondary | ICD-10-CM | POA: Diagnosis not present

## 2023-01-20 DIAGNOSIS — J4551 Severe persistent asthma with (acute) exacerbation: Secondary | ICD-10-CM | POA: Diagnosis not present

## 2023-01-20 DIAGNOSIS — K219 Gastro-esophageal reflux disease without esophagitis: Secondary | ICD-10-CM

## 2023-01-20 MED ORDER — AMOXICILLIN-POT CLAVULANATE 875-125 MG PO TABS: 1.0000 | ORAL_TABLET | Freq: Two times a day (BID) | ORAL | 0 refills | Status: AC

## 2023-01-20 MED ORDER — ALBUTEROL SULFATE HFA 108 (90 BASE) MCG/ACT IN AERS: INHALATION_SPRAY | RESPIRATORY_TRACT | 1 refills | Status: DC

## 2023-01-20 MED ORDER — LEVOCETIRIZINE DIHYDROCHLORIDE 5 MG PO TABS: 5.0000 mg | ORAL_TABLET | Freq: Every evening | ORAL | 1 refills | Status: DC

## 2023-01-20 MED ORDER — EPINEPHRINE 0.3 MG/0.3ML IJ SOAJ: INTRAMUSCULAR | 1 refills | Status: DC

## 2023-01-20 MED ORDER — SPIRIVA RESPIMAT 1.25 MCG/ACT IN AERS: 2.0000 | INHALATION_SPRAY | Freq: Every day | RESPIRATORY_TRACT | 1 refills | Status: DC

## 2023-01-20 MED ORDER — DULERA 200-5 MCG/ACT IN AERO: INHALATION_SPRAY | RESPIRATORY_TRACT | 1 refills | Status: DC

## 2023-01-20 MED ORDER — MONTELUKAST SODIUM 10 MG PO TABS: 10.0000 mg | ORAL_TABLET | Freq: Every day | ORAL | 1 refills | Status: DC

## 2023-01-20 MED ORDER — PREDNISONE 20 MG PO TABS: 40.0000 mg | ORAL_TABLET | Freq: Every day | ORAL | 0 refills | Status: AC

## 2023-01-20 MED ORDER — PANTOPRAZOLE SODIUM 40 MG PO TBEC: 40.0000 mg | DELAYED_RELEASE_TABLET | Freq: Every day | ORAL | 1 refills | Status: DC

## 2023-01-20 MED ORDER — METHYLPREDNISOLONE ACETATE 40 MG/ML IJ SUSP
40.0000 mg | Freq: Once | INTRAMUSCULAR | Status: AC
  Administered 2023-01-20: 40 mg via INTRAMUSCULAR

## 2023-01-20 NOTE — Telephone Encounter (Signed)
He has been getting dulera covered? Can we do the PA? Or do I need to choose something else?

## 2023-01-20 NOTE — Patient Instructions (Addendum)
Severe persistent asthma with current exacerbation due to acute sinusitis:  - 40 mg IM depo in clinic - tomorrow start 40 mg prednisone and continue daily for 4 days - today start augmentin 875 mg twice daily for 10 days, take with probiotics or steroids  Continue Dulera 200 mcg 2 puffs twice a day with spacer to help prevent cough and wheeze Continue Spiriva Respimat 1.25 mcg 2 puffs once a day to help prevent cough and wheeze Continue montelukast 10 mg at night to help prevent cough and wheeze.   During flares: Start Pulmicort 0.5 mg daily via nebulizer, use with Dulera and Spiriva for 2 weeks or until symptoms improve.  RESCUE: May use AirSUPRA 2 puffs as needed  for cough, wheeze, tightness in chest, or shortness of breath. Not to exceed 12 puffs in one day. Use in place of albuterol.  Asthma control goals:  Full participation in all desired activities (may need albuterol before activity) Albuterol use two time or less a week on average (not counting use with activity) Cough interfering with sleep two time or less a month Oral steroids no more than once a year No hospitalizations  Allergic rhinoconjunctivitis Continue Pataday (olopatadine) 0.2% using 1 drop each eye once a day as needed or cromolyn 1 drop up to 4 times daily as needed. for itchy watery eyes.   Continue Allegra 180 mg daily as needed. Can take 2 tablets on "bad allergy days".  Continue Ayr gel for nose as well as nasal saline rinses Discussed adding Mucinex [phone] mg twice daily as needed for increased mucus production.  Take with ample amounts of water  Atopic dermatitis Continue dupilumab injections and topical agents as per your dermatologist.   - fluicionolone drops 1 drop in each ear daily as needed for itchy ear canals  Anaphylactic shock due to food Avoid fish and shellfish. In case of an allergic reaction, give Benadryl 4 teaspoonfuls every 4 hours, and if life-threatening symptoms occur, inject with EpiPen  0.3 mg.  Epistaxis/hereditary hemorrhagic telangiectasia Continue to follow up with your ENT for this You may use saline nasal gel as needed  Heartburn Continue protonix (pantopraxole) 40 mg daily , take 30 minutes prior to breakfast - lifestyle and diet modifications - check with your referring doctor about the GI referral  Please let us know if this treatment plan is not working well for you Schedule a follow-up appointment in 6 months, sooner if needed.

## 2023-01-22 ENCOUNTER — Other Ambulatory Visit (HOSPITAL_COMMUNITY): Payer: Self-pay

## 2023-01-22 NOTE — Telephone Encounter (Signed)
Per test claim Alexander Hodge is still covered at this time with a $40.00 co-pay with [facility], pharmacy was closed when tried to call to verify it was being ran correctly.

## 2023-01-29 NOTE — Telephone Encounter (Signed)
Called [facility] pharmacy spoke  to Saltese she states the Elwin Sleight is now made by a different manufacturer and has a new NDC number it is covered with a $40 copay. They will order and call patient once it comes in.  I called patient left message

## 2023-03-19 NOTE — Progress Notes (Unsigned)
FOLLOW UP Date of Service/Encounter:  03/20/23   Subjective:  Alexander Hodge (DOB: 1961/02/20) is a 62 y.o. male who [facility] in re-evaluation of the following: severe persistent asthma, allergic rhinitis, allergic conjunctivitis, atopic dermatitis, anaphylactic shock due to food, hereditary hemorrhagic telangiectasia, and epistaxis  History obtained from: chart review and patient.  For Review, LV was on 01/20/23  with Dr.Shaka Cardin seen for routine follow-up. See below for summary of history and diagnostics.  Therapeutic plans/changes recommended: He was having a flare of his asthma due to respiratory illness at this visit. He was given 40 mg IM depo, prednisone burst and augmentin.  ----------------------------------------------------- Pertinent History/Diagnostics:  Asthma: Severe persistent, still symptomatic on dupixent. Briefly on teszpire + dupixent which helped with asthma, but insurance refused to pay for 2 biologics. Has been on xolair in the past. Stopped in 2020, used for many years prior to 2016.  State he was controlled when on this medications. He is very steroid responsive. Allergic Rhinitis:  Has hereditary hemorrhagic telangiectasia.  Has had nasal polyps in the past multiple times.   Has reflux, hx of hiatal hernia repair.  Has not been on AIT in the past. Eczema: Follows with Derm on Dupixent. Food allergy  Avoids fish and shellfish. Additional medical hx: MGUS; follows with heme/onc. --------------------------------------------------- Today presents for follow-up. He has been sneezing and feels itchy. Itchy ears, eyes, eyelids. He has been wheezing for the past 1-2 weeks.  His last visit was 2 months ago, and he was also flared.  Required prednisone at that time as well.  He has been controlled in past on Tezspire + Dupixent, but his insurance has refused to pay for both biologics. His dupixent has worked well for his  eczema.  He is using Airsupra twice daily. He is taking dulera 200 2 puffs BID, spiriva 2 puffs daily. Daily montelukast.  He has not started the pulmicort for current flare.  H His skin is still flaky but not as bad as prior to starting dupixent.  Denies tender nodules.  He does have a history of nasal polyps.  Has had several removed before. 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 No issues with fish and shellfish.Epipen up to date.  Allergies as of 03/20/2023       Reactions   Egg-derived Products Shortness Of Breath   Eicosapentaenoic Acid (epa) Rash, Hives   Unknown reaction Unknown reaction   Poultry Meal Shortness Of Breath   Shellfish Allergy Shortness Of Breath   Fish Allergy Other (See Comments)   Unknown reaction Unknown reaction Unknown reaction   Fish-derived Products Rash   Unknown reaction        Medication List        Accurate as of March 20, 2023 12:42 PM. If you have any questions, ask your nurse or doctor.          STOP taking these medications    tadalafil 5 MG tablet Commonly known as: CIALIS Stopped by: Verlee Monte, MD       TAKE these medications    acetaminophen 500 MG tablet Commonly known as: TYLENOL Take by mouth.   Airsupra 90-80 MCG/ACT Aero Generic drug: Albuterol-Budesonide Inhale 2 Inhalations into the lungs as needed (maximum of 12 inhalations daily).   albuterol 108 (90 Base) MCG/ACT inhaler Commonly known as: VENTOLIN HFA INHALE 2 PUFFS BY MOUTH EVERY 4 HOURS AS  NEEDED FOR WHEEZE OR FOR SHORTNESS OF BREATH   alclomethasone 0.05 % ointment Commonly known as: ACLOVATE 1 application 2 times daily.   alendronate 70 MG tablet Commonly known as: FOSAMAX Take 70 mg by mouth once a week.   aminocaproic acid 25 % solution Commonly known as: AMICAR   ammonium lactate 12 % lotion Commonly known as: LAC-HYDRIN 1(ONE) APPLICATION(S) TOPICAL 2(TWO) TIMES A DAY   atorvastatin 40  MG tablet Commonly known as: LIPITOR Take 40 mg by mouth daily.   budesonide 0.5 MG/2ML nebulizer solution Commonly known as: Pulmicort Take 2 mLs (0.5 mg total) by nebulization daily.   buPROPion 150 MG 24 hr tablet Commonly known as: WELLBUTRIN XL Take 1 tablet by mouth daily.   clobetasol cream 0.05 % Commonly known as: TEMOVATE Apply topically.   cromolyn 4 % ophthalmic solution Commonly known as: OPTICROM Place 1 drop into both eyes 4 (four) times daily as needed.   cyclobenzaprine 5 MG tablet Commonly known as: FLEXERIL Take 10 mg by mouth at bedtime.   desonide 0.05 % ointment Commonly known as: DESOWEN Apply topically.   diclofenac Sodium 1 % Gel Commonly known as: VOLTAREN Apply small amount sparingly to the right posterior heel and achilles tendon   diphenhydramine-acetaminophen 25-500 MG Tabs tablet Commonly known as: TYLENOL PM Take by mouth.   Dulera 200-5 MCG/ACT Aero Generic drug: mometasone-formoterol 2 PUFFS EVERY 12 HOURS TO PREVENT COUGHING OR WHEEZING   DULoxetine 60 MG capsule Commonly known as: CYMBALTA TAKE 1 CAPSULE BY MOUTH ONCE DAILY WITH FOOD   dupilumab 300 MG/2ML prefilled syringe Commonly known as: DUPIXENT Inject into the skin.   EPINEPHrine 0.3 mg/0.3 mL Soaj injection Commonly known as: EPI-PEN Use as directed for severe allergic reaction   ergocalciferol 1.25 MG (50000 UT) capsule Commonly known as: VITAMIN D2 TAKE 1 CAPSULE BY MOUTH ONE TIME PER WEEK   erythromycin ophthalmic ointment SMARTSIG:In Eye(s) Every Night PRN   famotidine 20 MG tablet Commonly known as: PEPCID Take 1 tablet (20 mg total) by mouth daily.   fexofenadine 180 MG tablet Commonly known as: ALLEGRA Take by mouth.   fluocinolone 0.01 % external solution Commonly known as: SYNALAR Use 1 drop in each ear canal up to two times daily as needed for itchy ears.   fluticasone 0.05 % cream Commonly known as: CUTIVATE   levocetirizine 5 MG  tablet Commonly known as: XYZAL Take 1 tablet (5 mg total) by mouth every evening. May take one extra tablet per day when allergies are bad.   lidocaine 5 % Commonly known as: LIDODERM Place onto the skin.   montelukast 10 MG tablet Commonly known as: SINGULAIR Take 1 tablet (10 mg total) by mouth at bedtime.   mupirocin ointment 2 % Commonly known as: BACTROBAN Apply to affected areas inside nose 3 times daily for 14 days   pantoprazole 40 MG tablet Commonly known as: Protonix Take 1 tablet (40 mg total) by mouth daily. What changed: Another medication with the same name was removed. Continue taking this medication, and follow the directions you see here. Changed by: Verlee Monte, MD   pimecrolimus 1 % cream Commonly known as: ELIDEL   pregabalin 100 MG capsule Commonly known as: LYRICA Take 100 mg by mouth 3 (three) times daily.   pregabalin 50 MG capsule Commonly known as: LYRICA Take 50 mg (1 capsule) in the morning, 50 mg ( 1 capsule)  in the evening, and 100 mg ( 2 capsules)  at bedtime  saline Gel APPLY TO NOSE ONCE OR TWICE A DAY FOR NASAL IRRITATION.   Sarna Sensitive 1 % Lotn Generic drug: pramoxine 1(ONE) APPLICATION(S) TOPICAL EVERY DAY   sildenafil 50 MG tablet Commonly known as: VIAGRA Take by mouth.   Spiriva Respimat 1.25 MCG/ACT Aers Generic drug: Tiotropium Bromide Monohydrate Inhale 2 puffs into the lungs daily.   tacrolimus 0.1 % ointment Commonly known as: PROTOPIC Apply 1 application topically 2 (two) times daily as needed.   tamoxifen 20 MG tablet Commonly known as: NOLVADEX Take 20 mg by mouth.   tamsulosin 0.4 MG Caps capsule Commonly known as: FLOMAX   triamcinolone cream 0.1 % Commonly known as: KENALOG 1 application as needed.       Past Medical History:  Diagnosis Date   Asthma    Past Surgical History:  Procedure Laterality Date   MASS EXCISION  02/21/2012   Procedure: MINOR EXCISION OF MASS;  Surgeon: Vita Erm., MD;  Location: [facility];  Service: Ophthalmology;;        cyst removal  left eye both lids   portacath removal  07/2017   infection   Otherwise, there have been no changes to his past medical history, surgical history, family history, or social history.  ROS: All others negative except as noted per HPI.   Objective:  BP 118/70   Pulse 66   Temp 98.3 F (36.8 C) (Temporal)   Resp 18   SpO2 99%  There is no height or weight on file to calculate BMI. Physical Exam: General Appearance:  Alert, cooperative, no distress, appears stated age  Head:  Normocephalic, without obvious abnormality, atraumatic  Eyes:  Conjunctiva clear, EOM's intact  Nose: Nares normal,  hypertrophic turbinates, blood clots visible in nares  Throat: Lips, tongue normal; teeth and gums normal, normal posterior oropharynx  Neck: Supple, symmetrical  Lungs:   end-expiratory wheezing, Respirations unlabored, no coughing  Heart:  regular rate and rhythm and no murmur, Appears well perfused  Extremities: No edema  Skin: Few dry flaky areas on shin, no eczematous patches  Neurologic: No gross deficits   Spirometry:  Tracings reviewed. His effort: Good reproducible efforts. FVC: 2.32L FEV1: 1.10L, 42% predicted FEV1/FVC ratio: 0.59 Interpretation: Spirometry consistent with mixed obstructive and restrictive disease. severe Please see scanned spirometry results for details.  Labs: see below  Assessment/Plan  Asthma not controlled. Second round of systemic steroids in 2024.  We discussed obtaining labs to look for other causes of severe asthma including EGPA, ABPA, anti-1 alphatrypsin.  May consider CT imaging/pulm referral pending results and response.  We are going to update his allergy testing. Discussed AIT.   Severe persistent asthma with current exacerbation due to acute sinusitis:  - 40 mg IM depo in clinic - tomorrow start 40 mg prednisone and continue daily for 4 days  (provided in clinic) - labs today for severe asthma.  Continue Dulera 200 mcg 2 puffs twice a day with spacer to help prevent cough and wheeze Continue Spiriva Respimat 1.25 mcg 2 puffs once a day to help prevent cough and wheeze Continue montelukast 10 mg at night to help prevent cough and wheeze.   During flares: Start Pulmicort 0.5 mg daily via nebulizer, use with Dulera and Spiriva for 2 weeks or until symptoms improve.  RESCUE: May use AirSUPRA 2 puffs as needed  for cough, wheeze, tightness in chest, or shortness of breath. Not to exceed 12 puffs in one day. Use in place of  albuterol.  Asthma control goals:  Full participation in all desired activities (may need albuterol before activity) Albuterol use two time or less a week on average (not counting use with activity) Cough interfering with sleep two time or less a month Oral steroids no more than once a year No hospitalizations  Allergic rhinoconjunctivitis Continue Pataday (olopatadine) 0.2% using 1 drop each eye once a day as needed or cromolyn 1 drop up to 4 times daily as needed. for itchy watery eyes.   Continue Allegra 180 mg daily as needed. Can take 2 tablets on "bad allergy days".  Continue Ayr gel for nose as well as nasal saline rinses Discussed adding Mucinex [phone] mg twice daily as needed for increased mucus production.  Take with ample amounts of water Updating allergy testing today, consider allergy injections once asthma controlled.  Atopic dermatitis Continue dupilumab injections and topical agents as per your dermatologist.   - fluicionolone drops 1 drop in each ear daily as needed for itchy ear canals  Anaphylactic shock due to food Avoid fish and shellfish. In case of an allergic reaction, give Benadryl 4 teaspoonfuls every 4 hours, and if life-threatening symptoms occur, inject with EpiPen 0.3 mg.  Epistaxis/hereditary hemorrhagic telangiectasia Continue to follow up with your ENT for this You may use  saline nasal gel as needed  Heartburn Continue protonix (pantopraxole) 40 mg daily , take 30 minutes prior to breakfast - lifestyle and diet modifications - check with your referring doctor about the GI referral  Please let us know if this treatment plan is not working well for you Schedule a follow-up appointment in 6 months, sooner if needed.   Tonny Bollman, MD  [facility]

## 2023-03-20 ENCOUNTER — Encounter: Payer: Self-pay | Admitting: Internal Medicine

## 2023-03-20 ENCOUNTER — Ambulatory Visit: Payer: 59 | Admitting: Internal Medicine

## 2023-03-20 VITALS — BP 118/70 | HR 66 | Temp 98.3°F | Resp 18

## 2023-03-20 DIAGNOSIS — J4551 Severe persistent asthma with (acute) exacerbation: Secondary | ICD-10-CM | POA: Diagnosis not present

## 2023-03-20 DIAGNOSIS — H1013 Acute atopic conjunctivitis, bilateral: Secondary | ICD-10-CM | POA: Diagnosis not present

## 2023-03-20 DIAGNOSIS — T7800XD Anaphylactic reaction due to unspecified food, subsequent encounter: Secondary | ICD-10-CM

## 2023-03-20 DIAGNOSIS — J3089 Other allergic rhinitis: Secondary | ICD-10-CM | POA: Diagnosis not present

## 2023-03-20 DIAGNOSIS — I780 Hereditary hemorrhagic telangiectasia: Secondary | ICD-10-CM

## 2023-03-20 DIAGNOSIS — K219 Gastro-esophageal reflux disease without esophagitis: Secondary | ICD-10-CM

## 2023-03-20 DIAGNOSIS — L2089 Other atopic dermatitis: Secondary | ICD-10-CM

## 2023-03-20 MED ORDER — METHYLPREDNISOLONE ACETATE 40 MG/ML IJ SUSP
40.0000 mg | Freq: Once | INTRAMUSCULAR | Status: AC
Start: 2023-03-20 — End: 2023-03-20
  Administered 2023-03-20: 40 mg via INTRAMUSCULAR

## 2023-03-20 MED ORDER — DULERA 200-5 MCG/ACT IN AERO: INHALATION_SPRAY | RESPIRATORY_TRACT | 5 refills | Status: DC

## 2023-03-20 MED ORDER — CROMOLYN SODIUM 4 % OP SOLN: 1.0000 [drp] | Freq: Four times a day (QID) | OPHTHALMIC | 3 refills | Status: DC | PRN

## 2023-03-20 MED ORDER — SPIRIVA RESPIMAT 1.25 MCG/ACT IN AERS: 2.0000 | INHALATION_SPRAY | Freq: Every day | RESPIRATORY_TRACT | 1 refills | Status: DC

## 2023-03-20 MED ORDER — MONTELUKAST SODIUM 10 MG PO TABS: 10.0000 mg | ORAL_TABLET | Freq: Every day | ORAL | 1 refills | Status: DC

## 2023-03-20 MED ORDER — AIRSUPRA 90-80 MCG/ACT IN AERO: 2.0000 | INHALATION_SPRAY | RESPIRATORY_TRACT | 3 refills | Status: DC | PRN

## 2023-03-20 MED ORDER — LEVOCETIRIZINE DIHYDROCHLORIDE 5 MG PO TABS: 5.0000 mg | ORAL_TABLET | Freq: Every evening | ORAL | 1 refills | Status: DC

## 2023-03-20 MED ORDER — PANTOPRAZOLE SODIUM 40 MG PO TBEC: 40.0000 mg | DELAYED_RELEASE_TABLET | Freq: Every day | ORAL | 1 refills | Status: DC

## 2023-03-20 NOTE — Patient Instructions (Addendum)
Severe persistent asthma with current exacerbation due to acute sinusitis:  - 40 mg IM depo in clinic - tomorrow start 40 mg prednisone and continue daily for 4 days - labs today for severe asthma.  Continue Dulera 200 mcg 2 puffs twice a day with spacer to help prevent cough and wheeze Continue Spiriva Respimat 1.25 mcg 2 puffs once a day to help prevent cough and wheeze Continue montelukast 10 mg at night to help prevent cough and wheeze.   During flares: Start Pulmicort 0.5 mg daily via nebulizer, use with Dulera and Spiriva for 2 weeks or until symptoms improve.  RESCUE: May use AirSUPRA 2 puffs as needed  for cough, wheeze, tightness in chest, or shortness of breath. Not to exceed 12 puffs in one day. Use in place of albuterol.  Asthma control goals:  Full participation in all desired activities (may need albuterol before activity) Albuterol use two time or less a week on average (not counting use with activity) Cough interfering with sleep two time or less a month Oral steroids no more than once a year No hospitalizations  Allergic rhinoconjunctivitis Continue Pataday (olopatadine) 0.2% using 1 drop each eye once a day as needed or cromolyn 1 drop up to 4 times daily as needed. for itchy watery eyes.   Continue Allegra 180 mg daily as needed. Can take 2 tablets on "bad allergy days".  Continue Ayr gel for nose as well as nasal saline rinses Discussed adding Mucinex [phone] mg twice daily as needed for increased mucus production.  Take with ample amounts of water Updating allergy testing today, consider allergy injections once asthma controlled.  Atopic dermatitis Continue dupilumab injections and topical agents as per your dermatologist.   - fluicionolone drops 1 drop in each ear daily as needed for itchy ear canals  Anaphylactic shock due to food Avoid fish and shellfish. In case of an allergic reaction, give Benadryl 4 teaspoonfuls every 4 hours, and if life-threatening  symptoms occur, inject with EpiPen 0.3 mg.  Epistaxis/hereditary hemorrhagic telangiectasia Continue to follow up with your ENT for this You may use saline nasal gel as needed  Heartburn Continue protonix (pantopraxole) 40 mg daily , take 30 minutes prior to breakfast - lifestyle and diet modifications - check with your referring doctor about the GI referral  Please let us know if this treatment plan is not working well for you Schedule a follow-up appointment in 6 months, sooner if needed.

## 2023-03-21 LAB — ANCA TITERS: Atypical pANCA: <1:20 {titer}

## 2023-03-21 LAB — ALLERGENS, ZONE 2

## 2023-03-21 LAB — ASPERGILLUS PRECIPITINS

## 2023-03-21 LAB — C-REACTIVE PROTEIN: CRP: 2 mg/L (ref 0–10)

## 2023-03-23 LAB — ALLERGENS, ZONE 2
Bahia Grass IgE: <0.1 kU/L
Cedar, Mountain IgE: <0.1 kU/L
D Pteronyssinus IgE: <0.1 kU/L
Mugwort IgE Qn: <0.1 kU/L
Plantain, English IgE: <0.1 kU/L
Sheep Sorrel IgE Qn: <0.1 kU/L
Stemphylium Herbarum IgE: <0.1 kU/L
Timothy Grass IgE: <0.1 kU/L

## 2023-03-23 LAB — ASPERGILLUS PRECIPITINS

## 2023-03-23 LAB — ALPHA-1-ANTITRYPSIN: A-1 Antitrypsin: 133 mg/dL (ref 101–187)

## 2023-03-23 LAB — SEDIMENTATION RATE: Sed Rate: 8 mm/hr (ref 0–30)

## 2023-03-24 LAB — ALLERGENS, ZONE 2
Alternaria Alternata IgE: 0.19 kU/L — AB
Bermuda Grass IgE: <0.1 kU/L
Cat Dander IgE: <0.1 kU/L
Cladosporium Herbarum IgE: <0.1 kU/L
Common Silver Birch IgE: <0.1 kU/L
D Farinae IgE: <0.1 kU/L
Dog Dander IgE: <0.1 kU/L
Johnson Grass IgE: <0.1 kU/L
Maple/Box Elder IgE: <0.1 kU/L
Nettle IgE: <0.1 kU/L
Oak, White IgE: <0.1 kU/L
Penicillium Chrysogen IgE: <0.1 kU/L
Pigweed, Rough IgE: <0.1 kU/L

## 2023-03-24 LAB — ASPERGILLUS PRECIPITINS
Aspergillus Flavus Antibodies: NEGATIVE
Aspergillus glaucus IgG: NEGATIVE
Aspergillus nidulans IgG: NEGATIVE

## 2023-03-24 LAB — IGE: IgE (Immunoglobulin E), Serum: 16 IU/mL (ref 6–495)

## 2023-03-24 LAB — ANCA TITERS
C-ANCA: <1:20 {titer}
P-ANCA: <1:20 {titer}

## 2023-05-14 ENCOUNTER — Telehealth: Payer: Self-pay | Admitting: *Deleted

## 2023-05-14 NOTE — Telephone Encounter (Signed)
L/m for patient to contact me in regards to restarting Tezspire

## 2023-05-14 NOTE — Telephone Encounter (Signed)
-----   Message from Verlee Monte sent at 03/24/2023 11:23 AM EDT ----- Please let Mr. Simonich know that his allergy/asthma labs returned and are reassuring. He does not have any positives on his allergy test except borderline to an outdoor mold.  I am going to send a message to Dannica Bickham to see about getting him back on Tezspire as his asthma is not currently controlled on dupixent.   Brennden Masten-Mr. Sones keeps coming in for flares. We had him on Tezspire and Dupixent at one time, but his insurance stopped paying. He is on dupixent for eczema prescribed by his dermatologist, but he keeps having asthma flares and requiring steroids.  I checked to look for EGPA or some other explanation that might qualify him for a different biologic, but all testing returned normal.  I would love to try either tezspire or fasenra if either could be added. His eosinophils are typically elevated. Thanks.

## 2023-06-06 ENCOUNTER — Other Ambulatory Visit: Payer: Self-pay | Admitting: Internal Medicine

## 2023-06-11 NOTE — Telephone Encounter (Signed)
L/m for patient again to reach out to me °

## 2023-06-16 NOTE — Telephone Encounter (Signed)
Spoke to patient and advised approval, copay card and submit to [employer] for Dorothea Ogle will reach out once delivery set to make appt to restart

## 2023-06-16 NOTE — Telephone Encounter (Signed)
Thank you Tammy!

## 2023-06-25 ENCOUNTER — Ambulatory Visit (INDEPENDENT_AMBULATORY_CARE_PROVIDER_SITE_OTHER): Payer: 59

## 2023-06-25 DIAGNOSIS — J4550 Severe persistent asthma, uncomplicated: Secondary | ICD-10-CM | POA: Diagnosis not present

## 2023-07-22 ENCOUNTER — Ambulatory Visit: Payer: Self-pay

## 2023-07-24 ENCOUNTER — Ambulatory Visit: Payer: Self-pay | Admitting: Internal Medicine

## 2023-08-18 ENCOUNTER — Telehealth: Payer: Self-pay | Admitting: *Deleted

## 2023-08-18 ENCOUNTER — Ambulatory Visit: Payer: 59

## 2023-08-18 NOTE — Telephone Encounter (Signed)
Patient called to find out status of Tezspire. I advised patient that due to new Ins will need to submit for approval and will reach out regarding Ins decision

## 2023-08-18 NOTE — Telephone Encounter (Signed)
Patient called due to his Tezspire not in clinic for injection. I advised him looks like new Ins so will need to submit for approval and send info to [employer]. I did get aprpoval and l/m for patient that I was sending same to [employer] and they should reach out to get ok to ship

## 2023-09-11 ENCOUNTER — Encounter: Payer: Self-pay | Admitting: Internal Medicine

## 2023-09-11 ENCOUNTER — Ambulatory Visit: Payer: 59 | Admitting: Internal Medicine

## 2023-09-11 ENCOUNTER — Other Ambulatory Visit: Payer: Self-pay | Admitting: Internal Medicine

## 2023-09-11 VITALS — BP 142/96 | HR 86 | Temp 98.1°F | Resp 16 | Ht 66.0 in | Wt 169.9 lb

## 2023-09-11 DIAGNOSIS — L308 Other specified dermatitis: Secondary | ICD-10-CM

## 2023-09-11 DIAGNOSIS — J3089 Other allergic rhinitis: Secondary | ICD-10-CM

## 2023-09-11 DIAGNOSIS — J4550 Severe persistent asthma, uncomplicated: Secondary | ICD-10-CM

## 2023-09-11 DIAGNOSIS — K219 Gastro-esophageal reflux disease without esophagitis: Secondary | ICD-10-CM | POA: Diagnosis not present

## 2023-09-11 DIAGNOSIS — I780 Hereditary hemorrhagic telangiectasia: Secondary | ICD-10-CM | POA: Diagnosis not present

## 2023-09-11 DIAGNOSIS — T7800XD Anaphylactic reaction due to unspecified food, subsequent encounter: Secondary | ICD-10-CM

## 2023-09-11 DIAGNOSIS — Z7952 Long term (current) use of systemic steroids: Secondary | ICD-10-CM

## 2023-09-11 MED ORDER — METHYLPREDNISOLONE ACETATE 40 MG/ML IJ SUSP
40.0000 mg | Freq: Once | INTRAMUSCULAR | Status: AC
  Administered 2023-09-11: 40 mg via INTRAMUSCULAR

## 2023-09-11 MED ORDER — AIRSUPRA 90-80 MCG/ACT IN AERO: 2.0000 | INHALATION_SPRAY | RESPIRATORY_TRACT | 3 refills | Status: DC | PRN

## 2023-09-11 MED ORDER — CROMOLYN SODIUM 4 % OP SOLN: 1.0000 [drp] | Freq: Four times a day (QID) | OPHTHALMIC | 3 refills | Status: AC | PRN

## 2023-09-11 MED ORDER — DULERA 200-5 MCG/ACT IN AERO: INHALATION_SPRAY | RESPIRATORY_TRACT | 5 refills | Status: DC

## 2023-09-11 MED ORDER — BUDESONIDE 0.5 MG/2ML IN SUSP: 0.5000 mg | Freq: Every day | RESPIRATORY_TRACT | 3 refills | Status: AC

## 2023-09-11 MED ORDER — PANTOPRAZOLE SODIUM 40 MG PO TBEC: 40.0000 mg | DELAYED_RELEASE_TABLET | Freq: Two times a day (BID) | ORAL | 1 refills | Status: DC

## 2023-09-11 MED ORDER — MONTELUKAST SODIUM 10 MG PO TABS: 10.0000 mg | ORAL_TABLET | Freq: Every day | ORAL | 1 refills | Status: DC

## 2023-09-11 MED ORDER — SPIRIVA RESPIMAT 1.25 MCG/ACT IN AERS: 2.0000 | INHALATION_SPRAY | Freq: Every day | RESPIRATORY_TRACT | 1 refills | Status: DC

## 2023-09-11 NOTE — Telephone Encounter (Signed)
Please send:  budesonide-formoterol (SYMBICORT) 160-4.5 MCG/ACT inhaler 2 puffs BID

## 2023-09-11 NOTE — Patient Instructions (Addendum)
Severe persistent asthma with current exacerbation: Methylprednisolone 40 mg today Prednisone 40 mg tablets starting tomorrow for 4 days. Chest CT for severe uncontrolled asthma, not responsive to multiple asthma biologics and high dose ICS/LABA/LAMA Therapy Continue Dulera 200 mcg 2 puffs twice a day with spacer to help prevent cough and wheeze Continue Spiriva Respimat 1.25 mcg 2 puffs once a day to help prevent cough and wheeze Continue montelukast 10 mg at night to help prevent cough and wheeze.   During flares: Start Pulmicort 0.5 mg daily via nebulizer, use with Dulera and Spiriva for 2 weeks or until symptoms improve.  RESCUE: May use AirSUPRA 2 puffs as needed  for cough, wheeze, tightness in chest, or shortness of breath. Not to exceed 12 puffs in one day. Use in place of albuterol.  Asthma control goals:  Full participation in all desired activities (may need albuterol before activity) Albuterol use two time or less a week on average (not counting use with activity) Cough interfering with sleep two time or less a month Oral steroids no more than once a year No hospitalizations  Allergic rhinoconjunctivitis Continue Pataday (olopatadine) 0.2% using 1 drop each eye once a day as needed or cromolyn 1 drop up to 4 times daily as needed. for itchy watery eyes.   Continue Allegra 180 mg daily as needed. Can take 2 tablets on "bad allergy days".  Continue Ayr gel for nose as well as nasal saline rinses Discussed adding Mucinex [phone] mg twice daily as needed for increased mucus production.  Take with ample amounts of water Last lab testing was negative.  Atopic dermatitis Continue dupilumab injections and topical agents as per your dermatologist.   - fluicionolone drops 1 drop in each ear daily as needed for itchy ear canals  Anaphylactic shock due to food Avoid fish and shellfish. In case of an allergic reaction, give Benadryl 4 teaspoonfuls every 4 hours, and if life-threatening  symptoms occur, inject with EpiPen 0.3 mg.  Epistaxis/hereditary hemorrhagic telangiectasia Continue to follow up with your ENT for this You may use saline nasal gel as needed  Heartburn Continue protonix (pantopraxole) 40 mg TWICE daily , take 30 minutes prior to breakfast - lifestyle and diet modifications - establish care with GI in January as scheduled  Please let us know if this treatment plan is not working well for you Schedule a follow-up appointment in 6 months, sooner if needed.

## 2023-09-11 NOTE — Progress Notes (Signed)
FOLLOW UP Date of Service/Encounter:  09/11/23  Subjective:  Alexander Hodge (DOB: 11/03/1960) is a 62 y.o. male who [facility] in re-evaluation of the following:  severe persistent asthma, allergic rhinitis, allergic conjunctivitis, atopic dermatitis, anaphylactic shock due to food, hereditary hemorrhagic telangiectasia, and epistaxis  History obtained from: chart review and patient.  For Review, LV was on 03/20/23  with Dr.Devanta Daniel seen for routine follow-up. See below for summary of history and diagnostics.  Therapeutic plans/changes recommended: FEV1 42%, Asthma not controlled. Second round of systemic steroids in 2024.  We gave prednisone, obtained labs for severe asthmatics (see below).  We did submit to restart Tezspire and was approved. He received one dose in October. However, his insurance changed ----------------------------------------------------- Pertinent History/Diagnostics:  Asthma: Severe persistent, still symptomatic on dupixent. Briefly on teszpire + dupixent which helped with asthma, but insurance refused to pay for 2 biologics. Has been on xolair in the past. Stopped in 2020, used for many years prior to 2016.  State he was controlled when on this medications. He is very steroid responsive. -03/20/23: IgE 16, negative c-ANCA, p-ANCA,, normal ESR 8, and CRP 2, normal A-1 antitrypsin level, negative Aspergillus precipitan Allergic Rhinitis:  Has hereditary hemorrhagic telangiectasia.  Has had nasal polyps in the past multiple times.   Has reflux, hx of hiatal hernia repair.  Has not been on AIT in the past. - repeat blood work 03/20/23: negative to environmental panel, borderline Alternaria 0.19, otherwise negative Eczema: Follows with Derm on Dupixent. Food allergy  Avoids fish and shellfish. Additional medical hx: MGUS; follows with heme/onc. --------------------------------------------------- Today presents for  follow-up. Discussed the use of AI scribe software for clinical note transcription with the patient, who gave verbal consent to proceed.  History of Present Illness   The patient, with a history of severe asthma and skin conditions, reports ongoing struggles with medication access and insurance coverage. He has been unable to obtain TEZPIRE, a medication previously prescribed, due to insurance issues. The patient recalls finding the medication helpful when he was able to take it. He also reports being on Dupixent, which he believes has been beneficial for his skin condition, but not for his asthma.  The patient denies needing steroids since his last visit in June. He reports his skin has improved, particularly on his hands. However, he expresses concern about potential issues with medication delivery from [facility], which has resulted in him needing to obtain samples of Dupixent from his Dermatology office and inability to obtain his Dorothea Ogle since October.  The patient also reports ongoing issues with reflux, despite taking protonix twice daily. He has been referred to a GI specialist, with an appointment scheduled for January. He also mentions experiencing pain in his side, for which he has undergone testing, but no cause has been identified yet.  The patient has been receiving regular iron infusions due to ongoing issues with bleeding from his HHT. He reports frequent nosebleeds, although these have decreased in frequency. He also mentions that his tongue has recently started to bleed again. He has been using a mouth rinse to manage this symptom.  He continues twice weekly iron infusions and regular follow-up with Oncology.  The patient also reports some issues with his asthma control. He does not frequently use his rescue inhaler, but notes that exposure to certain triggers, such as high fumes or smoke, can exacerbate his symptoms. He is currently on Dulera and Spiriva for his asthma, and also  takes  montelukast. He is concerned about his wheezing and tightness going into the holiday season.  The patient has also started taking medication for depression and anxiety. He reports changes in his eating patterns and weight fluctuations. He also mentions feeling tired frequently, which he believes may be related to his ongoing bleeding issues. He is currently in the process of applying for disability due to his health issues.      Chart Review: Last Tezspire injection in October ED  05/08/23-for bleeding nose Getting iron infusions every 2 weeks Last OV 04/23/23: "Pt presents for regular follow up of HHT. Has been intolerant of hemostatic interventions and requires ongoing infusions of iron for stability in blood counts, QOL. QOL seems to be poor still related to fatigue and generalized weakness related to his condition. Also has component of plasma cell dyscrasia under obs (MGUS). Pt would benefit from ongoing replacement of iron. "  All medications reviewed by clinical staff and updated in chart. No new pertinent medical or surgical history except as noted in HPI.  ROS: All others negative except as noted per HPI.   Objective:  BP (!) 142/96   Pulse 86   Temp 98.1 F (36.7 C) (Temporal)   Resp 16   Ht 5\' 6"  (1.676 m)   Wt 169 lb 14.4 oz (77.1 kg)   SpO2 99%   BMI 27.42 kg/m  Body mass index is 27.42 kg/m. Physical Exam: General Appearance:  Alert, cooperative, no distress, appears stated age  Head:  Normocephalic, without obvious abnormality, atraumatic  Eyes:  Conjunctiva clear, EOM's intact  Ears EACs normal bilaterally and normal TMs bilaterally  Nose: Nares normal,  erythematous mucosa with signs of recent bleeding in bilateral nostrils, hypertrophic turbinates, and no visible anterior polyps  Throat: Lips, tongue normal; teeth and gums normal, normal posterior oropharynx  Neck: Supple, symmetrical  Lungs:   Moderately restricted air movement without wheezing , Respirations  unlabored, no coughing  Heart:  regular rate and rhythm and no murmur, Appears well perfused  Extremities: No edema  Skin: Xerosis throughout  Neurologic: No gross deficits   Labs:  Lab Orders  No laboratory test(s) ordered today    Spirometry:  Tracings reviewed. His effort: Good reproducible efforts. FVC: 2.37L FEV1: 1.24L, 55% predicted FEV1/FVC ratio: 0.52 Interpretation: Spirometry consistent with moderate obstructive disease.  Please see scanned spirometry results for details.  Assessment/Plan   Severe persistent asthma with current exacerbation: Methylprednisolone 40 mg today Prednisone 40 mg tablets starting tomorrow for 4 days. Chest CT for severe uncontrolled asthma, not responsive to multiple asthma biologics and high dose ICS/LABA/LAMA Therapy Continue Dulera 200 mcg 2 puffs twice a day with spacer to help prevent cough and wheeze Continue Spiriva Respimat 1.25 mcg 2 puffs once a day to help prevent cough and wheeze Continue montelukast 10 mg at night to help prevent cough and wheeze.   During flares: Start Pulmicort 0.5 mg daily via nebulizer, use with Dulera and Spiriva for 2 weeks or until symptoms improve.  RESCUE: May use AirSUPRA 2 puffs as needed  for cough, wheeze, tightness in chest, or shortness of breath. Not to exceed 12 puffs in one day. Use in place of albuterol.  Asthma control goals:  Full participation in all desired activities (may need albuterol before activity) Albuterol use two time or less a week on average (not counting use with activity) Cough interfering with sleep two time or less a month Oral steroids no more than once a year No  hospitalizations  Allergic rhinoconjunctivitis-at goal Continue Pataday (olopatadine) 0.2% using 1 drop each eye once a day as needed or cromolyn 1 drop up to 4 times daily as needed. for itchy watery eyes.   Continue Allegra 180 mg daily as needed. Can take 2 tablets on "bad allergy days".  Continue Ayr gel for  nose as well as nasal saline rinses Discussed adding Mucinex [phone] mg twice daily as needed for increased mucus production.  Take with ample amounts of water Last lab testing was negative.  Atopic dermatitis-at goal Continue dupilumab injections and topical agents as per your dermatologist.   - fluicionolone drops 1 drop in each ear daily as needed for itchy ear canals  Anaphylactic shock due to food-stable Avoid fish and shellfish. In case of an allergic reaction, give Benadryl 4 teaspoonfuls every 4 hours, and if life-threatening symptoms occur, inject with EpiPen 0.3 mg.  Epistaxis/hereditary hemorrhagic telangiectasia-not at goal Continue to follow up with your ENT for this You may use saline nasal gel as needed  Heartburn-not at goal Continue protonix (pantopraxole) 40 mg TWICE daily , take 30 minutes prior to breakfast - lifestyle and diet modifications - establish care with GI in January as scheduled  Please let us know if this treatment plan is not working well for you Schedule a follow-up appointment in 6 months, sooner if needed.   Other: biologic given in clinic today-Tezspire sample  Tonny Bollman, MD  [facility]

## 2023-09-11 NOTE — Progress Notes (Signed)
Immunotherapy   Patient Details  Name: Alexander Hodge MRN: [id number] Date of Birth: 09-Jan-1961  09/11/2023  [name] received a sample of Tezspire 210 mg/1.91 mL in right arm Frequency: Every 28 days   Epi-Pen:Epi-Pen Available  . NDC 32440-102-72 Lot [id number] Expires 10/23/2024   Maurine Simmering 09/11/2023, 11:39 AM

## 2023-09-15 ENCOUNTER — Telehealth: Payer: Self-pay | Admitting: *Deleted

## 2023-09-15 NOTE — Telephone Encounter (Signed)
Spoke with insurance and no auth needed for chest CT. Reference # [id number]. Tried calling [employer] [phone] and had to leave a message for a return call.

## 2023-09-22 NOTE — Addendum Note (Signed)
Addended by: Maryjean Morn D on: 09/22/2023 04:28 PM   Modules accepted: Orders

## 2023-09-22 NOTE — Telephone Encounter (Signed)
Spoke with [facility] and scheduled pt for Monday January 6th at 3:00 pm. Tried calling pt and it went straight to voicemail- left message for return call.

## 2023-09-22 NOTE — Telephone Encounter (Signed)
Tried calling the imaging center and received no answer. Will try again later.

## 2023-09-23 ENCOUNTER — Telehealth (HOSPITAL_BASED_OUTPATIENT_CLINIC_OR_DEPARTMENT_OTHER): Payer: Self-pay

## 2023-09-23 NOTE — Telephone Encounter (Signed)
Pt returned my call and I let him know of his scheduled chest CT appointment.

## 2023-09-23 NOTE — Telephone Encounter (Signed)
Tried calling pt again no answer

## 2023-09-29 ENCOUNTER — Telehealth: Payer: Self-pay | Admitting: Internal Medicine

## 2023-09-29 ENCOUNTER — Ambulatory Visit (HOSPITAL_BASED_OUTPATIENT_CLINIC_OR_DEPARTMENT_OTHER)
Admission: RE | Admit: 2023-09-29 | Discharge: 2023-09-29 | Disposition: A | Discharge status: Home / Self Care | Payer: 59 | Source: Ambulatory Visit | Attending: Internal Medicine | Admitting: Internal Medicine

## 2023-09-29 DIAGNOSIS — J4550 Severe persistent asthma, uncomplicated: Secondary | ICD-10-CM | POA: Diagnosis present

## 2023-09-29 NOTE — Telephone Encounter (Signed)
 Spoke with pt his chest CT is at [facility] at 3:00. I gave him the address as well.

## 2023-09-29 NOTE — Telephone Encounter (Signed)
 Patient called and asked where was cat scan was today and the time and requested a call back at [phone].

## 2023-10-08 NOTE — Progress Notes (Signed)
 Please let Alexander Hodge know that his lung imaging did not show anything remarkable that we would not expect given the severity of his asthma. 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  It is important that he have regular follow-up with his primary care to address any cardiac risk factors.   Otherwise there was nothing notable on his exam that needs to be addressed or that would be another explanation for his lung symptoms.

## 2023-10-10 ENCOUNTER — Ambulatory Visit: Payer: 59

## 2023-11-10 ENCOUNTER — Ambulatory Visit: Payer: 59

## 2023-11-10 DIAGNOSIS — J4550 Severe persistent asthma, uncomplicated: Secondary | ICD-10-CM

## 2023-12-08 ENCOUNTER — Ambulatory Visit: Payer: 59

## 2023-12-08 DIAGNOSIS — J4550 Severe persistent asthma, uncomplicated: Secondary | ICD-10-CM | POA: Diagnosis not present

## 2024-01-05 ENCOUNTER — Ambulatory Visit

## 2024-01-06 ENCOUNTER — Ambulatory Visit

## 2024-01-06 DIAGNOSIS — J4550 Severe persistent asthma, uncomplicated: Secondary | ICD-10-CM

## 2024-01-07 ENCOUNTER — Telehealth: Payer: Self-pay

## 2024-01-07 NOTE — Telephone Encounter (Signed)
 New request came in. I printed our part if there is any changes for Dr Cornel Diesel to completer [place] I will change label to you. Thank you.

## 2024-01-07 NOTE — Telephone Encounter (Signed)
 Suma from [employer] called regarding patients medical records incident [phone] call back number [phone] they need patient records from 10/14/2023 to present they faxed release on April 8th 2025 and have not received them yet. I did advice Suma that the records take 7-10 working days and there is a fee, but I would try to check status? I tried calling [facility] reached Asha once fax is sent to [facility]  it is out of our hands. Unsure if we can assist  them? Can you please advice her/them

## 2024-01-14 NOTE — Telephone Encounter (Signed)
 We need to know if patient is still working to complete paperwork there will also be a fee before we can fax to [employer]

## 2024-01-16 NOTE — Telephone Encounter (Signed)
 Finally spoke to patient.Please place charge on his account. This is long term disability through his old employer he is considered total/ full disability. He is not retired.

## 2024-02-03 ENCOUNTER — Ambulatory Visit

## 2024-02-05 ENCOUNTER — Ambulatory Visit (INDEPENDENT_AMBULATORY_CARE_PROVIDER_SITE_OTHER)

## 2024-02-05 DIAGNOSIS — J4550 Severe persistent asthma, uncomplicated: Secondary | ICD-10-CM | POA: Diagnosis not present

## 2024-02-25 ENCOUNTER — Ambulatory Visit: Admitting: Internal Medicine

## 2024-03-03 ENCOUNTER — Ambulatory Visit: Admitting: Internal Medicine

## 2024-03-03 ENCOUNTER — Encounter: Payer: Self-pay | Admitting: Internal Medicine

## 2024-03-03 VITALS — BP 128/80 | HR 81 | Temp 97.7°F | Resp 18 | Wt 180.5 lb

## 2024-03-03 DIAGNOSIS — J4550 Severe persistent asthma, uncomplicated: Secondary | ICD-10-CM | POA: Diagnosis not present

## 2024-03-03 DIAGNOSIS — J3089 Other allergic rhinitis: Secondary | ICD-10-CM

## 2024-03-03 DIAGNOSIS — I780 Hereditary hemorrhagic telangiectasia: Secondary | ICD-10-CM

## 2024-03-03 DIAGNOSIS — K219 Gastro-esophageal reflux disease without esophagitis: Secondary | ICD-10-CM | POA: Diagnosis not present

## 2024-03-03 DIAGNOSIS — T7800XD Anaphylactic reaction due to unspecified food, subsequent encounter: Secondary | ICD-10-CM

## 2024-03-03 DIAGNOSIS — L308 Other specified dermatitis: Secondary | ICD-10-CM

## 2024-03-03 MED ORDER — FLUOCINOLONE ACETONIDE 0.01 % EX SOLN: CUTANEOUS | 1 refills | Status: AC

## 2024-03-03 MED ORDER — AIRSUPRA 90-80 MCG/ACT IN AERO: 2.0000 | INHALATION_SPRAY | RESPIRATORY_TRACT | 3 refills | Status: DC | PRN

## 2024-03-03 MED ORDER — SYMBICORT 160-4.5 MCG/ACT IN AERO: 2.0000 | INHALATION_SPRAY | Freq: Two times a day (BID) | RESPIRATORY_TRACT | 5 refills | Status: DC

## 2024-03-03 MED ORDER — MONTELUKAST SODIUM 10 MG PO TABS: 10.0000 mg | ORAL_TABLET | Freq: Every day | ORAL | 1 refills | Status: DC

## 2024-03-03 MED ORDER — SPIRIVA RESPIMAT 1.25 MCG/ACT IN AERS: 2.0000 | INHALATION_SPRAY | Freq: Every day | RESPIRATORY_TRACT | 1 refills | Status: DC

## 2024-03-03 NOTE — Patient Instructions (Addendum)
 Severe persistent asthma: Chest CT: 09/29/23: Lungs/Pleura: Mild paraseptal emphysema. Mild right apical pleuroparenchymal scarring.  Continue Dulera  200 mcg 2 puffs twice a day with spacer to help prevent cough and wheeze Continue Spiriva  Respimat 1.25 mcg 2 puffs once a day to help prevent cough and wheeze Continue montelukast  10 mg at night to help prevent cough and wheeze.   During flares: Start Pulmicort  0.5 mg daily via nebulizer, use with Dulera  and Spiriva  for 2 weeks or until symptoms improve. Continue tezspire  per protocol.  RESCUE: May use AirSUPRA  2 puffs as needed for cough, wheeze, tightness in chest, or shortness of breath. Not to exceed 12 puffs in one day. Use in place of albuterol .  Asthma control goals:  Full participation in all desired activities (may need albuterol  before activity) Albuterol  use two time or less a week on average (not counting use with activity) Cough interfering with sleep two time or less a month Oral steroids no more than once a year No hospitalizations  Allergic rhinoconjunctivitis Continue Pataday  (olopatadine ) 0.2% using 1 drop each eye once a day as needed or cromolyn  1 drop up to 4 times daily as needed. for itchy watery eyes.   Continue Zyrtec 10 mg daily as needed. Can take 2 tablets on bad allergy days.  Continue Ayr gel for nose as well as nasal saline rinses Discussed adding Mucinex [phone] mg twice daily as needed for increased mucus production.  Take with ample amounts of water Last lab testing was negative.  Atopic dermatitis Continue dupilumab injections and topical agents as per your dermatologist.   - fluicionolone drops 1 drop in each ear daily as needed for itchy ear canals  Anaphylactic shock due to food Avoid fish and shellfish. In case of an allergic reaction, give Benadryl 4 teaspoonfuls every 4 hours, and if life-threatening symptoms occur, inject with EpiPen  0.3 mg.  Epistaxis/hereditary hemorrhagic  telangiectasia Continue to follow up with your ENT for this You may use saline nasal gel as needed  Heartburn Continue protonix  (pantopraxole) 40 mg TWICE daily , take 30 minutes prior to breakfast - lifestyle and diet modifications - Continue follow-up with gastroenterology as planned  Please let us  know if this treatment plan is not working well for you Schedule a follow-up appointment in 6 months, sooner if needed.

## 2024-03-03 NOTE — Progress Notes (Signed)
 FOLLOW UP Date of Service/Encounter:   03/03/2024  Subjective:  Alexander Hodge (DOB: 08-31-1961) is a 63 y.o. male who [facility] in re-evaluation of the following: severe persistent asthma, allergic rhinitis, allergic conjunctivitis, atopic dermatitis, anaphylactic shock due to food, hereditary hemorrhagic telangiectasia, and epistaxis  History obtained from: chart review and patient.  For Review, LV was on 09/11/23  with Dr.Marquie Aderhold seen for routine follow-up. See below for summary of history and diagnostics.   Therapeutic plans/changes recommended: Asthma not controlled, attempted to get Tezspire  approved for him again as this was beneficial in the past.  He does take Dupixent through dermatology for eczema.  Having issues with axis and coverage.  FEV1 55% at that visit.  Prescribed IM methylprednisolone  and steroid burst.  CT chest ordered. ----------------------------------------------------- Pertinent History/Diagnostics:  Asthma: Severe persistent, still symptomatic on dupixent. Briefly on teszpire + dupixent which helped with asthma, but insurance refused to pay for 2 biologics. Has been on xolair  in the past. Stopped in 2020, used for many years prior to 2016.  State he was controlled when on this medications. He is very steroid responsive. -03/20/23: IgE 16, negative c-ANCA, p-ANCA,, normal ESR 8, and CRP 2, normal A-1 antitrypsin level, negative Aspergillus precipitan -Tezspire  restarted 11/10/2023. Chest CT: 09/29/23: Lungs/Pleura: Mild paraseptal emphysema. Mild right apical pleuroparenchymal scarring. Negative for subpleural reticulation, traction bronchiectasis/bronchiolectasis, ground glass, architectural distortion or honeycombing. 5 mm polygonal nodule adjacent to the lateral right hemidiaphragm (302/83), likely a benign subpleural lymph node. No pleural fluid. Airway is unremarkable. No air trapping. Allergic Rhinitis:  Has  hereditary hemorrhagic telangiectasia.  Has had nasal polyps in the past multiple times.   Has reflux, hx of hiatal hernia repair.  Has not been on AIT in the past. - repeat blood work 03/20/23: negative to environmental panel, borderline Alternaria 0.19, otherwise negative Eczema: Follows with Derm on Dupixent. Food allergy  Avoids fish and shellfish. Additional medical hx: MGUS; follows with heme/onc. --------------------------------------------------- Today presents for follow-up. Discussed the use of AI scribe software for clinical note transcription with the patient, who gave verbal consent to proceed.  History of Present Illness   Alexander Hodge is a 63 year old male with asthma and allergies who presents for a routine follow-up.  He has not experienced any breathing problems since his last visit and has not required additional steroids. He uses his rescue inhaler, Airsupra , once or twice a week and continues to take Dulera , Spiriva , and montelukast  regularly. He has not needed to use Pulmicort  via nebulizer. No recent exacerbations of asthma, and no significant wheezing or need for antibiotics.  He has not required additional steroids since starting Tezspire .  He experiences headaches and a sensation of pressure behind his eyes, which led him to consult an eye doctor. No issues were found with his eyes. He takes one Zyrtec daily for allergies.  We discussed increasing to twice daily as he cannot use nasal sprays due to HHT.  He continues to manage his eczema with Dupixent, having received a shot last Tuesday through his dermatology office.  He avoids fish and shellfish.  He notes recent skin peeling and dryness, which he manages with lotion and Vaseline. There is a family history of skin pigmentation issues, as his mother had similar problems.  He reports occasional epistaxis, particularly when blowing his nose hard, but he has not been severe. He visited an ENT specialist who found no  significant issues, and he is  scheduled for a follow-up in six months.  He has been experiencing sleep disturbances, characterized by difficulty staying asleep and waking up early. He often stays awake from 5 AM onwards, despite going to bed after midnight. He attributes some of his sleep issues to nocturia.  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 He is currently taking Protonix  twice daily.      All medications reviewed by clinical staff and updated in chart. No new pertinent medical or surgical history except as noted in HPI.  ROS: All others negative except as noted per HPI.   Objective:  BP 128/80   Pulse 81   Temp 97.7 F (36.5 C) (Temporal)   Resp 18   Wt 180 lb 8 oz (81.9 kg)   SpO2 98%   BMI 29.13 kg/m  Body mass index is 29.13 kg/m. Physical Exam: General Appearance:  Alert, cooperative, no distress, appears stated age  Head:  Normocephalic, without obvious abnormality, atraumatic  Eyes:  Conjunctiva clear, EOM's intact  Ears EACs normal bilaterally and normal TMs bilaterally  Nose: Nares normal, dried bright red blood in left nostril without active bleeding, hypertrophic turbinates, and normal mucosa  Throat: Lips, tongue normal; teeth and gums normal, normal posterior oropharynx  Neck: Supple, symmetrical  Lungs:   clear to auscultation bilaterally, Respirations unlabored, no coughing  Heart:  regular rate and rhythm and no murmur, Appears well perfused  Extremities: No edema  Skin: Skin color, texture, turgor normal and no rashes or lesions on visualized portions of skin  Neurologic: No gross deficits   Labs:  Lab Orders  No laboratory test(s) ordered today    Spirometry:  Tracings reviewed. His effort: Good reproducible efforts. FVC: 2.05L FEV1: 1.13L, 43% predicted FEV1/FVC ratio: 0.55 Interpretation: Spirometry consistent with mixed obstructive and restrictive disease.  Please see scanned spirometry results  for details.  Assessment/Plan   Severe persistent asthma: No exacerbations since starting Tezspire . Chest CT: 09/29/23: Lungs/Pleura: Mild paraseptal emphysema. Mild right apical pleuroparenchymal scarring.  Continue Dulera  200 mcg 2 puffs twice a day with spacer to help prevent cough and wheeze Continue Spiriva  Respimat 1.25 mcg 2 puffs once a day to help prevent cough and wheeze Continue montelukast  10 mg at night to help prevent cough and wheeze.   During flares: Start Pulmicort  0.5 mg daily via nebulizer, use with Dulera  and Spiriva  for 2 weeks or until symptoms improve. Continue tezspire  per protocol.  RESCUE: May use AirSUPRA  2 puffs as needed for cough, wheeze, tightness in chest, or shortness of breath. Not to exceed 12 puffs in one day. Use in place of albuterol .  Asthma control goals:  Full participation in all desired activities (may need albuterol  before activity) Albuterol  use two time or less a week on average (not counting use with activity) Cough interfering with sleep two time or less a month Oral steroids no more than once a year No hospitalizations  Allergic rhinoconjunctivitis-at goal Continue Pataday  (olopatadine ) 0.2% using 1 drop each eye once a day as needed or cromolyn  1 drop up to 4 times daily as needed. for itchy watery eyes.   Continue Zyrtec 10 mg daily as needed. Can take 2 tablets on bad allergy days.  Continue Ayr gel for nose as well as nasal saline rinses Discussed adding Mucinex [phone] mg twice daily as needed for increased mucus production.  Take with ample amounts of water Last lab testing was negative.  Atopic dermatitis-managed by dermatology, not at  goal Continue dupilumab injections and topical agents as per your dermatologist.   - fluicionolone drops 1 drop in each ear daily as needed for itchy ear canals  Anaphylactic shock due to food-stable Avoid fish and shellfish. In case of an allergic reaction, give Benadryl 4 teaspoonfuls every 4  hours, and if life-threatening symptoms occur, inject with EpiPen  0.3 mg.  Epistaxis/hereditary hemorrhagic telangiectasia-stable Continue to follow up with your ENT for this You may use saline nasal gel as needed  Heartburn-stable Continue protonix  (pantopraxole) 40 mg TWICE daily , take 30 minutes prior to breakfast - lifestyle and diet modifications - Continue follow-up with gastroenterology as planned  Please let us  know if this treatment plan is not working well for you Schedule a follow-up appointment in 6 months, sooner if needed.   Other: none  Jonathon Neighbors, MD  [facility] 

## 2024-03-04 ENCOUNTER — Ambulatory Visit

## 2024-03-11 ENCOUNTER — Ambulatory Visit: Payer: 59 | Admitting: Internal Medicine

## 2024-03-18 ENCOUNTER — Other Ambulatory Visit (HOSPITAL_COMMUNITY): Payer: Self-pay

## 2024-03-18 ENCOUNTER — Telehealth: Payer: Self-pay

## 2024-03-18 NOTE — Addendum Note (Signed)
 Addended by: WILLIAMS, Lynetta Tomczak H on: 03/18/2024 01:52 PM   Modules accepted: Orders

## 2024-03-18 NOTE — Telephone Encounter (Signed)
*  AA  Pharmacy Patient Advocate Encounter   Received notification from CoverMyMeds that prior authorization for Symbicort  is required/requested.   Insurance verification completed.   The patient is insured through [facility] .   Per test claim:  Breyna  10.3gm  is preferred by the insurance.  If suggested medication is appropriate, Please send in a new RX and discontinue this one. If not, please advise as to why it's not appropriate so that we may request a Prior Authorization. Please note, some preferred medications may still require a PA.  If the suggested medications have not been trialed and there are no contraindications to their use, the PA will not be submitted, as it will not be approved.

## 2024-03-18 NOTE — Telephone Encounter (Signed)
 Adjustment to the original order was made to fill generic done.

## 2024-03-18 NOTE — Telephone Encounter (Signed)
 Rx for symbicort  generic 90 days supplies 1 refill sent in to pt pharmacy.

## 2024-03-22 ENCOUNTER — Ambulatory Visit (INDEPENDENT_AMBULATORY_CARE_PROVIDER_SITE_OTHER)

## 2024-03-22 DIAGNOSIS — J4550 Severe persistent asthma, uncomplicated: Secondary | ICD-10-CM | POA: Diagnosis not present

## 2024-04-19 ENCOUNTER — Ambulatory Visit

## 2024-04-22 ENCOUNTER — Ambulatory Visit (INDEPENDENT_AMBULATORY_CARE_PROVIDER_SITE_OTHER)

## 2024-04-22 DIAGNOSIS — J4550 Severe persistent asthma, uncomplicated: Secondary | ICD-10-CM

## 2024-05-20 ENCOUNTER — Ambulatory Visit

## 2024-06-04 ENCOUNTER — Telehealth: Payer: Self-pay

## 2024-06-04 NOTE — Telephone Encounter (Signed)
 Pt called to see if his Tezspire  had been delivered. It's not here. Pt wants to know why he has trouble getting his tezspire  delivered. Advised patient I would send message to Tammy to see if she can assist. Pt would like a call back and it's okay to leave voicemail and he'll return the call. Best CB# [phone]

## 2024-06-10 NOTE — Telephone Encounter (Signed)
 Patient was due to ship for 8/26 but did not go out. L/m for patient delivery scheduled for 9/19 and he can schedule next week to come get injection to reach out to clinic

## 2024-06-17 ENCOUNTER — Ambulatory Visit

## 2024-06-17 DIAGNOSIS — J4550 Severe persistent asthma, uncomplicated: Secondary | ICD-10-CM

## 2024-06-27 ENCOUNTER — Other Ambulatory Visit: Payer: Self-pay | Admitting: Internal Medicine

## 2024-07-07 ENCOUNTER — Telehealth: Payer: Self-pay

## 2024-07-07 NOTE — Telephone Encounter (Signed)
 Rosaline from [facility] calling about Dupixent will be mailed today and should arrive 07/08/24 once received medication can not be returned.

## 2024-07-15 ENCOUNTER — Ambulatory Visit (INDEPENDENT_AMBULATORY_CARE_PROVIDER_SITE_OTHER)

## 2024-07-15 DIAGNOSIS — J4550 Severe persistent asthma, uncomplicated: Secondary | ICD-10-CM

## 2024-08-04 ENCOUNTER — Other Ambulatory Visit: Payer: Self-pay | Admitting: Internal Medicine

## 2024-08-12 ENCOUNTER — Ambulatory Visit

## 2024-08-12 DIAGNOSIS — J4550 Severe persistent asthma, uncomplicated: Secondary | ICD-10-CM

## 2024-09-02 ENCOUNTER — Ambulatory Visit: Admitting: Internal Medicine

## 2024-09-09 ENCOUNTER — Encounter: Payer: Self-pay | Admitting: Internal Medicine

## 2024-09-09 ENCOUNTER — Ambulatory Visit: Admitting: Internal Medicine

## 2024-09-09 ENCOUNTER — Ambulatory Visit

## 2024-09-09 VITALS — BP 136/70 | HR 78 | Resp 20

## 2024-09-09 DIAGNOSIS — T7800XD Anaphylactic reaction due to unspecified food, subsequent encounter: Secondary | ICD-10-CM | POA: Diagnosis not present

## 2024-09-09 DIAGNOSIS — I780 Hereditary hemorrhagic telangiectasia: Secondary | ICD-10-CM | POA: Diagnosis not present

## 2024-09-09 DIAGNOSIS — J4550 Severe persistent asthma, uncomplicated: Secondary | ICD-10-CM

## 2024-09-09 DIAGNOSIS — L2089 Other atopic dermatitis: Secondary | ICD-10-CM

## 2024-09-09 DIAGNOSIS — K219 Gastro-esophageal reflux disease without esophagitis: Secondary | ICD-10-CM | POA: Diagnosis not present

## 2024-09-09 DIAGNOSIS — H1013 Acute atopic conjunctivitis, bilateral: Secondary | ICD-10-CM

## 2024-09-09 DIAGNOSIS — J4551 Severe persistent asthma with (acute) exacerbation: Secondary | ICD-10-CM

## 2024-09-09 DIAGNOSIS — J3089 Other allergic rhinitis: Secondary | ICD-10-CM

## 2024-09-09 DIAGNOSIS — Z7952 Long term (current) use of systemic steroids: Secondary | ICD-10-CM

## 2024-09-09 MED ORDER — PANTOPRAZOLE SODIUM 40 MG PO TBEC: 40.0000 mg | DELAYED_RELEASE_TABLET | Freq: Two times a day (BID) | ORAL | 1 refills | Status: AC

## 2024-09-09 MED ORDER — MONTELUKAST SODIUM 10 MG PO TABS: 10.0000 mg | ORAL_TABLET | Freq: Every day | ORAL | 1 refills | Status: AC

## 2024-09-09 MED ORDER — SYMBICORT 160-4.5 MCG/ACT IN AERO: 2.0000 | INHALATION_SPRAY | Freq: Two times a day (BID) | RESPIRATORY_TRACT | 5 refills | Status: AC

## 2024-09-09 MED ORDER — EPINEPHRINE 0.3 MG/0.3ML IJ SOAJ: INTRAMUSCULAR | 1 refills | Status: DC

## 2024-09-09 MED ORDER — SPIRIVA RESPIMAT 1.25 MCG/ACT IN AERS: 2.0000 | INHALATION_SPRAY | Freq: Every day | RESPIRATORY_TRACT | 1 refills | Status: AC

## 2024-09-09 MED ADMIN — Methylprednisolone Acetate Inj Susp 40 MG/ML: 40 mg | INTRAMUSCULAR | @ 11:00:00 | NDC 00781352275

## 2024-09-09 NOTE — Progress Notes (Signed)
 FOLLOW UP Date of Service/Encounter:   09/09/2024  Subjective:  Alexander Hodge (DOB: Oct 04, 1960) is a 63 y.o. male who [facility] in re-evaluation of the following: severe persistent asthma, allergic rhinitis, allergic conjunctivitis, atopic dermatitis, anaphylactic shock due to food, hereditary hemorrhagic telangiectasia, and epistaxis  History obtained from: chart review and patient.  For Review, LV was on 03/03/24  with Dr.Shanitra Phillippi seen for routine follow-up. See below for summary of history and diagnostics.   Therapeutic plans/changes recommended: FEV1 43%, no asthma exacerbations since restarting Tezspire .  ----------------------------------------------------- Pertinent History/Diagnostics:  Asthma: Severe persistent, still symptomatic on dupixent. Briefly on teszpire + dupixent which helped with asthma, but insurance refused to pay for 2 biologics. Has been on xolair  in the past. Stopped in 2020, used for many years prior to 2016.  State he was controlled when on this medications. He is very steroid responsive. -03/20/23: IgE 16, negative c-ANCA, p-ANCA,, normal ESR 8, and CRP 2, normal A-1 antitrypsin level, negative Aspergillus precipitan -Tezspire  restarted 11/10/2023. Chest CT: 09/29/23: Lungs/Pleura: Mild paraseptal emphysema. Mild right apical pleuroparenchymal scarring. Negative for subpleural reticulation, traction bronchiectasis/bronchiolectasis, ground glass, architectural distortion or honeycombing. 5 mm polygonal nodule adjacent to the lateral right hemidiaphragm (302/83), likely a benign subpleural lymph node. No pleural fluid. Airway is unremarkable. No air trapping. Allergic Rhinitis:  Has hereditary hemorrhagic telangiectasia.  Has had nasal polyps in the past multiple times.   Has reflux, hx of hiatal hernia repair.  Has not been on AIT in the past. - repeat blood work 03/20/23: negative to environmental panel,  borderline Alternaria 0.19, otherwise negative Eczema: Follows with Derm on Dupixent. Food allergy  Avoids fish and shellfish. Additional medical hx: MGUS; follows with heme/onc. --------------------------------------------------- Today presents for follow-up. Discussed the use of AI scribe software for clinical note transcription with the patient, who gave verbal consent to proceed.  History of Present Illness Alexander Hodge is a 63 year old male with asthma who presents with wheezing and congestion.  Respiratory symptoms - Wheezing and congestion for the past week - Sensation of being 'wheezy' and congested in the throat - Moving air well - Increased clear, non-purulent mucus production - No fever - No cough - More wheezing than usual  Asthma management - Uses Teszpire, Dupixent as prescribed - uses Airsupra  twice daily with perceived benefit - Uses Dulera  two puffs twice daily - Uses Spiriva  two puffs once daily - Pulmicort  available for nebulizer use,not currently using  He is due for his Tezspire  today, also received in clinic.  Dermatologic symptoms - Woke up with a cold sore this morning - Skin less flaky and dry than previously - Uses prescribed lotions for skin care  Epistaxis - Fewer episodes of epistaxis compared to the past  Musculoskeletal symptoms - Back pain and spasms - Undergoing therapy for back pain - Prescribed two medications to aid with sleep   All medications reviewed by clinical staff and updated in chart. No new pertinent medical or surgical history except as noted in HPI.  ROS: All others negative except as noted per HPI.   Objective:  BP 136/70   Pulse 78   Resp 20   SpO2 98%  There is no height or weight on file to calculate BMI. Physical Exam: General Appearance:  Alert, cooperative, no distress, appears stated age  Head:  Normocephalic, without obvious abnormality, atraumatic  Eyes:  Conjunctiva clear, EOM's intact  Ears EACs  normal bilaterally and normal  TMs bilaterally  Nose: Nares normal, hypertrophic turbinates and normal mucosa  Throat: Lips, tongue normal; teeth and gums normal, normal posterior oropharynx  Neck: Supple, symmetrical  Lungs:   clear to auscultation bilaterally, Respirations unlabored, no coughing  Heart:  regular rate and rhythm and no murmur, Appears well perfused  Extremities: No edema  Skin: Dry scaly skin on bilateral lower extermities  Neurologic: No gross deficits   Labs:  Lab Orders  No laboratory test(s) ordered today    Spirometry:  Tracings reviewed. His effort: Good reproducible efforts. FVC: 2.54L FEV1: 1.53L, 69% predicted FEV1/FVC ratio: 0.60 Interpretation: Spirometry consistent with mild obstructive disease.  Please see scanned spirometry results for details.  Assessment/Plan  Today presenting with acute asthma exacerbation.  Will treat with burst of prednisone .  Did discuss consideration of antibiotics for possible sinus infection if symptoms have not improved by early next week.  Patient has been instructed to call our office if still having ongoing drainage and mucus production at that point.  Would send in Augmentin  875 twice daily x 10 days if needed.  Currently do not think antibiotics are indicated.  Severe persistent asthma with exacerbation: Chest CT: 09/29/23: Lungs/Pleura: Mild paraseptal emphysema. Mild right apical pleuroparenchymal scarring.  - 40 mg IM depo given in clinic - tomorrow start prednisone  40 mg daily x 4 days. Continue Dulera  200 mcg 2 puffs twice a day with spacer to help prevent cough and wheeze Continue Spiriva  Respimat 1.25 mcg 2 puffs once a day to help prevent cough and wheeze Continue montelukast  10 mg at night to help prevent cough and wheeze.   During flares: Start Pulmicort  0.5 mg daily via nebulizer, use with Dulera  and Spiriva  for 2 weeks or until symptoms improve. Continue tezspire  per protocol.  RESCUE: May use AirSUPRA  2  puffs as needed for cough, wheeze, tightness in chest, or shortness of breath. Not to exceed 12 puffs in one day. Use in place of albuterol .  Asthma control goals:  Full participation in all desired activities (may need albuterol  before activity) Albuterol  use two time or less a week on average (not counting use with activity) Cough interfering with sleep two time or less a month Oral steroids no more than once a year No hospitalizations  Allergic rhinoconjunctivitis-at goal Continue Pataday  (olopatadine ) 0.2% using 1 drop each eye once a day as needed or cromolyn  1 drop up to 4 times daily as needed. for itchy watery eyes.   Continue Zyrtec 10 mg daily as needed. Can take 2 tablets on bad allergy days.  Continue Ayr gel for nose as well as nasal saline rinses Discussed adding Mucinex [phone] mg twice daily as needed for increased mucus production.  Take with ample amounts of water Last lab testing was negative.  Atopic dermatitis-at goal Continue dupilumab injections and topical agents as per your dermatologist.   - fluicionolone drops 1 drop in each ear daily as needed for itchy ear canals  Anaphylactic shock due to food-stable Avoid fish and shellfish. In case of an allergic reaction, give Benadryl 4 teaspoonfuls every 4 hours, and if life-threatening symptoms occur, inject with EpiPen  0.3 mg.  Epistaxis/hereditary hemorrhagic telangiectasia-at goal Continue to follow up with your ENT for this You may use saline nasal gel as needed  Heartburn-at goal Continue protonix  (pantopraxole) 40 mg TWICE daily , take 30 minutes prior to breakfast - lifestyle and diet modifications - Continue follow-up with gastroenterology as planned  Please let us  know if this treatment plan is not  working well for you Schedule a follow-up appointment in 6 months, sooner if needed.   Other: none  Rocky Endow, MD  [facility] 

## 2024-09-09 NOTE — Patient Instructions (Addendum)
 Severe persistent asthma with exacerbation: Chest CT: 09/29/23: Lungs/Pleura: Mild paraseptal emphysema. Mild right apical pleuroparenchymal scarring.  - 40 mg IM depo given in clinic - tomorrow start prednisone  40 mg daily x 4 days. Continue Dulera  200 mcg 2 puffs twice a day with spacer to help prevent cough and wheeze Continue Spiriva  Respimat 1.25 mcg 2 puffs once a day to help prevent cough and wheeze Continue montelukast  10 mg at night to help prevent cough and wheeze.   During flares: Start Pulmicort  0.5 mg daily via nebulizer, use with Dulera  and Spiriva  for 2 weeks or until symptoms improve. Continue tezspire  per protocol.  RESCUE: May use AirSUPRA  2 puffs as needed for cough, wheeze, tightness in chest, or shortness of breath. Not to exceed 12 puffs in one day. Use in place of albuterol .  Asthma control goals:  Full participation in all desired activities (may need albuterol  before activity) Albuterol  use two time or less a week on average (not counting use with activity) Cough interfering with sleep two time or less a month Oral steroids no more than once a year No hospitalizations  Allergic rhinoconjunctivitis Continue Pataday  (olopatadine ) 0.2% using 1 drop each eye once a day as needed or cromolyn  1 drop up to 4 times daily as needed. for itchy watery eyes.   Continue Zyrtec 10 mg daily as needed. Can take 2 tablets on bad allergy days.  Continue Ayr gel for nose as well as nasal saline rinses Discussed adding Mucinex [phone] mg twice daily as needed for increased mucus production.  Take with ample amounts of water Last lab testing was negative.  Atopic dermatitis Continue dupilumab injections and topical agents as per your dermatologist.   - fluicionolone drops 1 drop in each ear daily as needed for itchy ear canals  Anaphylactic shock due to food Avoid fish and shellfish. In case of an allergic reaction, give Benadryl 4 teaspoonfuls every 4 hours, and if  life-threatening symptoms occur, inject with EpiPen  0.3 mg.  Epistaxis/hereditary hemorrhagic telangiectasia Continue to follow up with your ENT for this You may use saline nasal gel as needed  Heartburn Continue protonix  (pantopraxole) 40 mg TWICE daily , take 30 minutes prior to breakfast - lifestyle and diet modifications - Continue follow-up with gastroenterology as planned  Please let us  know if this treatment plan is not working well for you Schedule a follow-up appointment in 6 months, sooner if needed.

## 2024-09-13 NOTE — Addendum Note (Signed)
 Addended by: MARINDA JANSKY on: 09/13/2024 01:41 PM   Modules accepted: Orders

## 2024-10-06 ENCOUNTER — Telehealth: Payer: Self-pay

## 2024-10-06 NOTE — Telephone Encounter (Signed)
 Pt called for more prednsione due to lingering symptoms and appt was schedule.

## 2024-10-06 NOTE — Patient Instructions (Addendum)
 Severe persistent asthma with acute exacerbation Depo medrol  40 mg given IM in office. Let us  know if you are not getting better.  Can consider additional steroids by mouth or antibiotic (Augmentin ) based on symptoms Chest CT: 09/29/23: Lungs/Pleura: Mild paraseptal emphysema. Mild right apical pleuroparenchymal scarring.  Continue Dulera  200 mcg 2 puffs twice a day with spacer to help prevent cough and wheeze Continue Spiriva  Respimat 1.25 mcg 2 puffs once a day to help prevent cough and wheeze Continue montelukast  10 mg at night to help prevent cough and wheeze.   During flares: Start Pulmicort  0.5 mg daily via nebulizer, use with Dulera  and Spiriva  for 2 weeks or until symptoms improve. Continue tezspire  per protocol.  Tezspire  given in office today  RESCUE: May use AirSUPRA  2 puffs as needed for cough, wheeze, tightness in chest, or shortness of breath. Not to exceed 12 puffs in one day. Use in place of albuterol .  Asthma control goals:  Full participation in all desired activities (may need albuterol  before activity) Albuterol  use two time or less a week on average (not counting use with activity) Cough interfering with sleep two time or less a month Oral steroids no more than once a year No hospitalizations  Allergic rhinoconjunctivitis Continue Pataday  (olopatadine ) 0.2% using 1 drop each eye once a day as needed or cromolyn  1 drop up to 4 times daily as needed. for itchy watery eyes.   Continue Zyrtec 10 mg daily as needed. Can take 2 tablets on bad allergy days.  Continue Ayr gel for nose as well as nasal saline rinses Discussed adding Mucinex [phone] mg twice daily as needed for increased mucus production.  Take with ample amounts of water Last lab testing was negative.  Atopic dermatitis Continue dupilumab injections and topical agents as per your dermatologist.   - fluicionolone drops 1 drop in each ear daily as needed for itchy ear canals  Anaphylactic shock due to  food Avoid fish and shellfish. In case of an allergic reaction, give Benadryl 4 teaspoonfuls every 4 hours, and if life-threatening symptoms occur, inject with EpiPen  0.3 mg.  Epistaxis/hereditary hemorrhagic telangiectasia Continue to follow up with your ENT for this You may use saline nasal gel as needed  Heartburn Continue protonix  (pantopraxole) 40 mg TWICE daily , take 30 minutes prior to breakfast - lifestyle and diet modifications - Continue follow-up with gastroenterology as planned  Verbally discussed speaking with his primary care physician and ear nose and throat doctor about his witnessed apneas/snoring Please let us  know if this treatment plan is not working well for you Schedule a follow-up appointment in 3 months, sooner if needed.

## 2024-10-07 ENCOUNTER — Ambulatory Visit: Admitting: *Deleted

## 2024-10-07 ENCOUNTER — Ambulatory Visit: Admitting: Family

## 2024-10-07 ENCOUNTER — Encounter: Payer: Self-pay | Admitting: Family

## 2024-10-07 VITALS — BP 138/88 | HR 88 | Temp 98.2°F | Resp 20 | Wt 179.4 lb

## 2024-10-07 DIAGNOSIS — T7800XD Anaphylactic reaction due to unspecified food, subsequent encounter: Secondary | ICD-10-CM

## 2024-10-07 DIAGNOSIS — J4551 Severe persistent asthma with (acute) exacerbation: Secondary | ICD-10-CM

## 2024-10-07 DIAGNOSIS — Z7952 Long term (current) use of systemic steroids: Secondary | ICD-10-CM | POA: Diagnosis not present

## 2024-10-07 DIAGNOSIS — J4550 Severe persistent asthma, uncomplicated: Secondary | ICD-10-CM | POA: Diagnosis not present

## 2024-10-07 DIAGNOSIS — L2089 Other atopic dermatitis: Secondary | ICD-10-CM

## 2024-10-07 DIAGNOSIS — I780 Hereditary hemorrhagic telangiectasia: Secondary | ICD-10-CM

## 2024-10-07 DIAGNOSIS — J3089 Other allergic rhinitis: Secondary | ICD-10-CM

## 2024-10-07 DIAGNOSIS — H1013 Acute atopic conjunctivitis, bilateral: Secondary | ICD-10-CM

## 2024-10-07 DIAGNOSIS — K219 Gastro-esophageal reflux disease without esophagitis: Secondary | ICD-10-CM

## 2024-10-07 MED ORDER — METHYLPREDNISOLONE ACETATE 40 MG/ML IJ SUSP
40.0000 mg | Freq: Once | INTRAMUSCULAR | Status: AC
  Administered 2024-10-07: 40 mg via INTRAMUSCULAR

## 2024-10-07 MED ORDER — EPINEPHRINE 0.3 MG/0.3ML IJ SOAJ: INTRAMUSCULAR | 1 refills | Status: AC

## 2024-10-07 NOTE — Progress Notes (Signed)
 "  [address] [place] [address] Dept: [phone]  FOLLOW UP NOTE  Patient ID: Alexander Hodge, male    DOB: [birth year]  Age: 64 y.o. MRN: [id number] Date of Office Visit: 10/07/2024  Assessment  Chief Complaint: Follow-up (Sneezing, left side of throat is scratchy, little wheezy. Doing extra breathing treatment. Taking mucinex. )  HPI Alexander Hodge is a 64 year old male who presents today for acute visit of wheezing.  He was last seen on September 09, 2024 by Dr. Marinda for severe persistent asthma with exacerbation, allergic rhinoconjunctivitis, atopic dermatitis, anaphylactic shock due to food, epistaxis/hereditary hemorrhagic telangiectasia, and heartburn.  He denies any new diagnosis or surgery since his last office visit.    Severe persistent asthma: He reports that the steroid injection and prednisone   by mouth that was prescribed at the last office visit helped his breathing symptoms and they resolved, but for the past week or so he has felt mucus running down his throat and he feels like that is what bothers him.  He ran out of his Mucinex so he borrowed some of his wife's.  This morning the mucus in his throat was clear.  He also has itchy throat, itchy ears, and his wheezing more than normal.  He denies cough, tightness in chest, shortness of breath, fever, and chills.  He continues to take Dulera  200 mcg 2 puffs twice a day, Spiriva  Respimat 1.25 mcg 2 puffs once a day, montelukast  10 mg at night, and Tezspire  injections per protocol.  He denies any problems or reactions with his Tezspire  injections.  He has not tried using Pulmicort  0.05 mg via his nebulizer for flares.  Allergic rhinitis: He reports nasal congestion, sinus pressure, and postnasal drip that is clear.  He feels like he has rhinorrhea.  He denies itchy watery eyes.  He continues to take Zyrtec 10 mg daily and has been taking Mucinex.  Atopic dermatitis is reported as coming along pretty well.SABRA  He still has some  dry skin.  He continues to receive dupilumab injections as prescribed by his dermatologist.  Anaphylactic shock due to food: He continues to avoid fish and shellfish without any accidental ingestion or use of his epinephrine  autoinjector device.  Epistaxis/hereditary hemorrhagic telangiectasia: He continues to follow-up with ear nose and throat.  He reports that when he picked a scab recently he did see a little bit of yellow.  Heartburn: He continues to take pantoprazole  40 mg twice a day.  He reports that his heartburn symptoms are the same.  He did take a Tums this morning.  He reports that he has symptoms every time he eats.  He does have a follow-up appointment with GI in March.  He reports that approximately 7 to 10 years ago he had a sleep study said he had sleep apnea, but was told he did not need a machine.  He does report snoring and witnessed apneas by his wife.  He does also report that he hardly sleeps at night.  The last time he slept was Saturday.   Drug Allergies:  Allergies[1]  Review of Systems: Negative except as per HPI   Physical Exam: BP 138/88   Pulse 88   Temp 98.2 F (36.8 C) (Temporal)   Resp 20   Wt 179 lb 6.4 oz (81.4 kg)   SpO2 97%   BMI 28.96 kg/m    Physical Exam Constitutional:      Appearance: Normal appearance.  HENT:  Head: Normocephalic and atraumatic.     Comments: Pharynx normal, eyes normal, ears normal, nose: Scabbing with some old blood noted in bilateral nostrils    Right Ear: Tympanic membrane, ear canal and external ear normal.     Left Ear: Tympanic membrane, ear canal and external ear normal.     Mouth/Throat:     Mouth: Mucous membranes are moist.     Pharynx: Oropharynx is clear.  Eyes:     Conjunctiva/sclera: Conjunctivae normal.  Cardiovascular:     Rate and Rhythm: Regular rhythm.     Heart sounds: Normal heart sounds.  Pulmonary:     Effort: Pulmonary effort is normal.     Breath sounds: Normal breath sounds.      Comments: Lungs clear to auscultation Musculoskeletal:     Cervical back: Neck supple.  Skin:    General: Skin is warm.  Neurological:     Mental Status: He is alert and oriented to person, place, and time.  Psychiatric:        Mood and Affect: Mood normal.        Behavior: Behavior normal.        Thought Content: Thought content normal.        Judgment: Judgment normal.     Diagnostics: FVC 2.83 L (101%), FEV1 1.56 L (70%), FEV1/FVC 0.55.  Spirometry indicates mild airway obstruction.  Assessment and Plan: 1. Other allergic rhinitis   2. Severe persistent asthma with acute exacerbation (HCC)   3. Atopic dermatitis   4. Allergic conjunctivitis of both eyes   5. Anaphylactic shock due to food, subsequent encounter   6. Current chronic use of systemic steroids   7. Hereditary hemorrhagic telangiectasia   8. Gastroesophageal reflux disease, unspecified whether esophagitis present     Meds ordered this encounter  Medications   methylPREDNISolone  acetate (DEPO-MEDROL ) injection 40 mg   EPINEPHrine  0.3 mg/0.3 mL IJ SOAJ injection    Sig: Use as directed for severe allergic reaction    Dispense:  2 each    Refill:  1    Dispense mylan generic or brand name whichever is cheaper for the pt. Do not fill the adrenaclick .    Patient Instructions  Severe persistent asthma with acute exacerbation Depo medrol  40 mg given IM in office. Let us  know if you are not getting better.  Can consider additional steroids by mouth or antibiotic (Augmentin ) based on symptoms Chest CT: 09/29/23: Lungs/Pleura: Mild paraseptal emphysema. Mild right apical pleuroparenchymal scarring.  Continue Dulera  200 mcg 2 puffs twice a day with spacer to help prevent cough and wheeze Continue Spiriva  Respimat 1.25 mcg 2 puffs once a day to help prevent cough and wheeze Continue montelukast  10 mg at night to help prevent cough and wheeze.   During flares: Start Pulmicort  0.5 mg daily via nebulizer, use with Dulera   and Spiriva  for 2 weeks or until symptoms improve. Continue tezspire  per protocol.  Tezspire  given in office today  RESCUE: May use AirSUPRA  2 puffs as needed for cough, wheeze, tightness in chest, or shortness of breath. Not to exceed 12 puffs in one day. Use in place of albuterol .  Asthma control goals:  Full participation in all desired activities (may need albuterol  before activity) Albuterol  use two time or less a week on average (not counting use with activity) Cough interfering with sleep two time or less a month Oral steroids no more than once a year No hospitalizations  Allergic rhinoconjunctivitis Continue Pataday  (olopatadine ) 0.2% using 1 drop  each eye once a day as needed or cromolyn  1 drop up to 4 times daily as needed. for itchy watery eyes.   Continue Zyrtec 10 mg daily as needed. Can take 2 tablets on bad allergy days.  Continue Ayr gel for nose as well as nasal saline rinses Discussed adding Mucinex [phone] mg twice daily as needed for increased mucus production.  Take with ample amounts of water Last lab testing was negative.  Atopic dermatitis Continue dupilumab injections and topical agents as per your dermatologist.   - fluicionolone drops 1 drop in each ear daily as needed for itchy ear canals  Anaphylactic shock due to food Avoid fish and shellfish. In case of an allergic reaction, give Benadryl 4 teaspoonfuls every 4 hours, and if life-threatening symptoms occur, inject with EpiPen  0.3 mg.  Epistaxis/hereditary hemorrhagic telangiectasia Continue to follow up with your ENT for this You may use saline nasal gel as needed  Heartburn Continue protonix  (pantopraxole) 40 mg TWICE daily , take 30 minutes prior to breakfast - lifestyle and diet modifications - Continue follow-up with gastroenterology as planned  Verbally discussed speaking with his primary care physician and ear nose and throat doctor about his witnessed apneas/snoring Please let us  know if  this treatment plan is not working well for you Schedule a follow-up appointment in 3 months, sooner if needed.   Return in about 3 months (around 01/05/2025), or if symptoms worsen or fail to improve.    Thank you for the opportunity to care for this patient.  Please do not hesitate to contact me with questions.  Wanda Craze, FNP [facility]         [1]  Allergies Allergen Reactions   Egg Protein-Containing Drug Products Shortness Of Breath   Eicosapentaenoic Acid (Epa) (Fish) Rash and Hives    Unknown reaction Unknown reaction   Poultry Meal Shortness Of Breath    Avoids scrambled eggs   Shellfish Allergy Shortness Of Breath   Fish Allergy Other (See Comments)    Unknown reaction Unknown reaction Unknown reaction   Fish Protein-Containing Drug Products Rash    Unknown reaction   "

## 2024-11-04 ENCOUNTER — Ambulatory Visit
